# Patient Record
Sex: Male | Born: 1955
Health system: Southern US, Community
[De-identification: ages and names within clinical notes are randomized; demographics above are authoritative.]

## PROBLEM LIST (undated history)

## (undated) DIAGNOSIS — Z973 Presence of spectacles and contact lenses: Secondary | ICD-10-CM

## (undated) DIAGNOSIS — K219 Gastro-esophageal reflux disease without esophagitis: Secondary | ICD-10-CM

## (undated) DIAGNOSIS — M25812 Other specified joint disorders, left shoulder: Secondary | ICD-10-CM

## (undated) DIAGNOSIS — Z860101 Personal history of adenomatous and serrated colon polyps: Secondary | ICD-10-CM

## (undated) DIAGNOSIS — J302 Other seasonal allergic rhinitis: Secondary | ICD-10-CM

## (undated) DIAGNOSIS — Z86010 Personal history of colonic polyps: Secondary | ICD-10-CM

## (undated) DIAGNOSIS — Z8709 Personal history of other diseases of the respiratory system: Secondary | ICD-10-CM

## (undated) DIAGNOSIS — M7542 Impingement syndrome of left shoulder: Secondary | ICD-10-CM

## (undated) DIAGNOSIS — E781 Pure hyperglyceridemia: Secondary | ICD-10-CM

## (undated) DIAGNOSIS — I10 Essential (primary) hypertension: Secondary | ICD-10-CM

## (undated) DIAGNOSIS — E039 Hypothyroidism, unspecified: Secondary | ICD-10-CM

## (undated) DIAGNOSIS — E785 Hyperlipidemia, unspecified: Secondary | ICD-10-CM

## (undated) HISTORY — DX: Personal history of colonic polyps: Z86.010

## (undated) HISTORY — DX: Pure hyperglyceridemia: E78.1

## (undated) HISTORY — PX: TONSILLECTOMY: SUR1361

## (undated) HISTORY — PX: ROTATOR CUFF REPAIR: SHX139

## (undated) HISTORY — DX: Hypothyroidism, unspecified: E03.9

## (undated) HISTORY — DX: Hyperlipidemia, unspecified: E78.5

## (undated) HISTORY — DX: Other seasonal allergic rhinitis: J30.2

## (undated) HISTORY — DX: Gastro-esophageal reflux disease without esophagitis: K21.9

## (undated) HISTORY — DX: Essential (primary) hypertension: I10

## (undated) HISTORY — DX: Personal history of adenomatous and serrated colon polyps: Z86.0101

## (undated) HISTORY — PX: CHOLECYSTECTOMY: SHX55

---

## 1998-06-13 ENCOUNTER — Ambulatory Visit (HOSPITAL_COMMUNITY): Admission: RE | Admit: 1998-06-13 | Discharge: 1998-06-13 | Payer: Self-pay | Admitting: Neurosurgery

## 1998-06-13 ENCOUNTER — Encounter: Payer: Self-pay | Admitting: Neurosurgery

## 1998-06-21 ENCOUNTER — Encounter: Payer: Self-pay | Admitting: Neurosurgery

## 1998-06-23 ENCOUNTER — Encounter: Payer: Self-pay | Admitting: Neurosurgery

## 1998-06-23 ENCOUNTER — Inpatient Hospital Stay (HOSPITAL_COMMUNITY): Admission: RE | Admit: 1998-06-23 | Discharge: 1998-06-24 | Payer: Self-pay | Admitting: Neurosurgery

## 1999-03-13 HISTORY — PX: NECK SURGERY: SHX720

## 2000-03-16 ENCOUNTER — Encounter: Payer: Self-pay | Admitting: Neurosurgery

## 2000-03-16 ENCOUNTER — Ambulatory Visit (HOSPITAL_COMMUNITY): Admission: RE | Admit: 2000-03-16 | Discharge: 2000-03-16 | Payer: Self-pay | Admitting: Neurosurgery

## 2000-03-29 ENCOUNTER — Ambulatory Visit (HOSPITAL_COMMUNITY): Admission: RE | Admit: 2000-03-29 | Discharge: 2000-03-29 | Payer: Self-pay | Admitting: Neurosurgery

## 2000-03-29 ENCOUNTER — Encounter: Payer: Self-pay | Admitting: Neurosurgery

## 2000-04-12 ENCOUNTER — Ambulatory Visit (HOSPITAL_COMMUNITY): Admission: RE | Admit: 2000-04-12 | Discharge: 2000-04-12 | Payer: Self-pay | Admitting: Neurosurgery

## 2000-04-12 ENCOUNTER — Encounter: Payer: Self-pay | Admitting: Neurosurgery

## 2000-04-26 ENCOUNTER — Encounter: Payer: Self-pay | Admitting: Neurosurgery

## 2000-04-26 ENCOUNTER — Ambulatory Visit (HOSPITAL_COMMUNITY): Admission: RE | Admit: 2000-04-26 | Discharge: 2000-04-26 | Payer: Self-pay | Admitting: Neurosurgery

## 2001-05-30 ENCOUNTER — Ambulatory Visit (HOSPITAL_COMMUNITY): Admission: RE | Admit: 2001-05-30 | Discharge: 2001-05-30 | Payer: Self-pay | Admitting: Internal Medicine

## 2001-05-30 ENCOUNTER — Encounter: Payer: Self-pay | Admitting: Internal Medicine

## 2001-06-26 ENCOUNTER — Ambulatory Visit (HOSPITAL_COMMUNITY): Admission: RE | Admit: 2001-06-26 | Discharge: 2001-06-26 | Payer: Self-pay | Admitting: Otolaryngology

## 2001-06-26 ENCOUNTER — Encounter: Payer: Self-pay | Admitting: Otolaryngology

## 2001-07-24 ENCOUNTER — Ambulatory Visit (HOSPITAL_COMMUNITY): Admission: RE | Admit: 2001-07-24 | Discharge: 2001-07-24 | Payer: Self-pay | Admitting: Internal Medicine

## 2001-07-25 ENCOUNTER — Encounter: Payer: Self-pay | Admitting: Internal Medicine

## 2003-09-07 ENCOUNTER — Ambulatory Visit: Admission: RE | Admit: 2003-09-07 | Discharge: 2003-09-07 | Payer: Self-pay | Admitting: Neurosurgery

## 2004-02-01 ENCOUNTER — Ambulatory Visit: Payer: Self-pay | Admitting: Pulmonary Disease

## 2004-03-12 HISTORY — PX: COLONOSCOPY: SHX174

## 2004-03-12 HISTORY — PX: ROTATOR CUFF REPAIR: SHX139

## 2004-06-02 ENCOUNTER — Ambulatory Visit: Payer: Self-pay | Admitting: *Deleted

## 2004-06-12 ENCOUNTER — Ambulatory Visit: Payer: Self-pay | Admitting: *Deleted

## 2004-06-12 ENCOUNTER — Ambulatory Visit (HOSPITAL_COMMUNITY): Admission: RE | Admit: 2004-06-12 | Discharge: 2004-06-12 | Payer: Self-pay | Admitting: *Deleted

## 2004-08-08 ENCOUNTER — Emergency Department (HOSPITAL_COMMUNITY): Admission: EM | Admit: 2004-08-08 | Discharge: 2004-08-08 | Payer: Self-pay | Admitting: Emergency Medicine

## 2004-08-21 ENCOUNTER — Ambulatory Visit (HOSPITAL_COMMUNITY): Admission: RE | Admit: 2004-08-21 | Discharge: 2004-08-21 | Payer: Self-pay | Admitting: Neurosurgery

## 2004-08-28 ENCOUNTER — Encounter: Admission: RE | Admit: 2004-08-28 | Discharge: 2004-08-28 | Payer: Self-pay | Admitting: Neurosurgery

## 2004-09-22 ENCOUNTER — Encounter: Admission: RE | Admit: 2004-09-22 | Discharge: 2004-09-22 | Payer: Self-pay | Admitting: Neurosurgery

## 2004-11-27 ENCOUNTER — Encounter (HOSPITAL_COMMUNITY): Admission: RE | Admit: 2004-11-27 | Discharge: 2004-12-09 | Payer: Self-pay | Admitting: Neurosurgery

## 2004-12-11 ENCOUNTER — Encounter (HOSPITAL_COMMUNITY): Admission: RE | Admit: 2004-12-11 | Discharge: 2005-01-10 | Payer: Self-pay | Admitting: Neurosurgery

## 2005-02-16 ENCOUNTER — Ambulatory Visit (HOSPITAL_COMMUNITY): Admission: RE | Admit: 2005-02-16 | Discharge: 2005-02-16 | Payer: Self-pay | Admitting: Internal Medicine

## 2005-02-16 ENCOUNTER — Encounter: Payer: Self-pay | Admitting: Obstetrics & Gynecology

## 2005-02-16 ENCOUNTER — Ambulatory Visit: Payer: Self-pay | Admitting: Internal Medicine

## 2008-01-22 ENCOUNTER — Ambulatory Visit (HOSPITAL_COMMUNITY): Admission: RE | Admit: 2008-01-22 | Discharge: 2008-01-22 | Payer: Self-pay | Admitting: Internal Medicine

## 2010-03-07 ENCOUNTER — Encounter (INDEPENDENT_AMBULATORY_CARE_PROVIDER_SITE_OTHER): Payer: Self-pay

## 2010-04-13 NOTE — Letter (Signed)
Summary: Recall, Screening Colonoscopy Only  [facility]  [address]   [place], [place] [address]   Phone: [phone]  Fax: [phone]    March 07, 2010  Louis Mccullough [address] [place], [place]  [address] 10-14-1955   Dear Louis Mccullough,   Our records indicate it is time to schedule your colonoscopy.    Please call our office at [phone] and ask for the nurse.   Thank you,  Hendricks Limes, LPN Cloria Spring, LPN  [facility] Ph: [phone]   Fax: [phone]

## 2010-07-28 NOTE — Procedures (Signed)
[facility]  Patient:    Louis Mccullough, SOKOLOWSKI Visit Number: [id number] MRN: [id number]          Service Type: OUT Location: RAD Attending Physician:  Carylon Perches Dictated by:   Carylon Perches, M.D. Proc. Date: 07/24/01 Admit Date:  07/24/2001                                Stress Test  Mr. Propes exercised 10 minutes 3 seconds (1 minute, 3 seconds into stage IV of the Bruce protocol) attaining a maximal heart rate of 160 (92% of the age predicted maximal heart rate) at a work load of 12.9 METS and discontinued exercise due to his blood pressure elevation.  There was a peak blood pressure response at 246/98 during stage III.  There were no symptoms of chest pain. There were no arrhythmias.  There were no ST segment changes diagnostic of ischemia, although he had approximately 0.5 mm ST segment depression laterally.  Cardiolite images are pending.  IMPRESSION:  No definite evidence of exercise induced ischemia, hypertensive blood pressure response to exercise.  Cardiolite images pending. Dictated by:   Carylon Perches, M.D. Attending Physician:  Carylon Perches DD:  07/24/01 TD:  07/27/01 Job: [id number]

## 2010-07-28 NOTE — Op Note (Signed)
NAME:  Louis Mccullough, Louis Mccullough                  ACCOUNT NO.:  [id number]   MEDICAL RECORD NO.:  [id number]          PATIENT TYPE:  AMB   LOCATION:  DAY                           FACILITY:  [facility]   PHYSICIAN:  R. Roetta Sessions, M.D. DATE OF BIRTH:  December 06, 1955   DATE OF PROCEDURE:  02/16/2005  DATE OF DISCHARGE:                                 OPERATIVE REPORT   PROCEDURE:  Colonoscopy with biopsy.   INDICATIONS FOR PROCEDURE:  The patient is a 55 year old gentleman devoid of  any lower GI tract symptoms with no family history of colorectal cancer in  first degree relatives. He has never had his lower GI tract imaged. He is  now referred by Dr. Carylon Perches for colorectal cancer screening. Colonoscopy  is now being done as a screening maneuver. This approach has been discussed  with the patient at length. Potential risks, benefits, and alternatives have  been reviewed and questions answered. He is agreeable. Please see  documentation in the medical record.   PROCEDURE NOTE:  O2 saturation, blood pressure, pulse, and respirations were  monitored throughout the entire procedure. Conscious sedation with Versed 6  mg IV and Demerol 125 mg IV in divided doses.   INSTRUMENT:  Olympus video chip system.   FINDINGS:  Digital rectal exam revealed no abnormalities.   ENDOSCOPIC FINDINGS:  Prep was adequate.   Rectum:  Examination of the rectal mucosa including retroflexed view of the  anal verge revealed no abnormalities.   Colon:  Colonic mucosa was surveyed from the rectosigmoid junction through  the left, transverse, and right colon to the area of the appendiceal  orifice, ileocecal valve, and cecum. These structures were well seen and  photographed for the record. From this level, the scope was slowly  withdrawn, and all previously mentioned mucosal surfaces were again seen.  The patient had scattered left sided diverticula and a single diminutive 2-  mm polyp at the splenic flexure. The remainder of  the colonic mucosa  appeared normal. The polyp was cold biopsied/removed. The patient tolerated  the procedure well and was reactive to endoscopy.   IMPRESSION:  1.  Normal rectum.  2.  Left sided diverticula. Diminutive polyp at the splenic flexure, cold      biopsied/removed. The remainder of the colonic mucosa appeared normal.   RECOMMENDATIONS:  1.  Diverticulosis literature provided to [name].  2.  Follow up on pathology.  3.  Further recommendations to follow.      Jonathon Bellows, M.D.  Electronically Signed     RMR/MEDQ  D:  02/16/2005  T:  02/16/2005  Job:  [id number]   cc:   Kingsley Callander. Ouida Sills, MD  Fax: [phone]

## 2010-07-28 NOTE — Procedures (Signed)
NAME:  Louis Mccullough, FRERICKS NO.:  [id number]   MEDICAL RECORD NO.:  [id number]         PATIENT TYPE:  REC   LOCATION:  RAD                           FACILITY:  [facility]   PHYSICIAN:  Vida Roller, M.D.   DATE OF BIRTH:  Mar 30, 1955   DATE OF PROCEDURE:  DATE OF DISCHARGE:                                    STRESS TEST   HISTORY:  A 55 year old gentleman with no known coronary disease with  atypical chest discomfort, cardiac risk factors including dyslipidemia,  hypertension and family history.   BASELINE DATA:  EKG reveals a sinus rhythm at 74 beats per minute with  nonspecific ST abnormalities.  Blood pressure is 140/88.   The patient exercised for a total of nine minutes to 10.1 METS.  Maximum  heart rate achieved was 153 beats per minute which is 90% of predicted  maximum.  Maximum blood pressure is 160/88 and resolved down to 132/70 in  recovery.  The patient was noted to have some inferolateral ST depression  and T wave inversion which resolved in recovery.  A few PVCs were noted.  The patient denied any chest discomfort.  He did have some shortness of  breath at the end of exercise.   Final images and results are pending in the review.      AB/MEDQ  D:  06/12/2004  T:  06/12/2004  Job:  [id number]

## 2010-07-28 NOTE — Procedures (Signed)
NAME:  Louis Mccullough, Louis Mccullough NO.:  [id number]   MEDICAL RECORD NO.:  [id number]          PATIENT TYPE:  OUT   LOCATION:  SLEEP LAB                     FACILITY:  [facility]   PHYSICIAN:  Marcelyn Bruins, M.D. [facility] DATE OF BIRTH:  Nov 08, 1955   DATE OF ADMISSION:  09/07/2003  DATE OF DISCHARGE:  09/07/2003                              NOCTURNAL POLYSOMNOGRAM   REFERRING PHYSICIAN:  Dr. Trey Sailors   INDICATION FOR THE STUDY:  Hypersomnia with sleep apnea.   SLEEP ARCHITECTURE:  Total sleep time was 326 minutes with decreased REM  being noted.  There was normal sleep onset latency but a mildly prolonged  REM onset latency.   IMPRESSIONS/RECOMMENDATIONS:  1. Mild obstructive sleep apnea-hypopnea syndrome with a Respiratory     Disturbance Index of 14 events per hour and mild oxygen desaturation.     Events were not positional however, they were clearly rapid eye movement     related.  The patient did not meet split-night criteria due to the small     numbers of events.  2. Loud snoring noted.  3. No cardiac arrhythmias.  4. Large numbers of leg movements with significant sleep disruption.     Clinical correlation is suggested.                                   ______________________________                                Marcelyn Bruins, M.D. [facility]     KC/MEDQ  D:  09/24/2003 16:48:45  T:  09/25/2003 22:37:59  Job:  [id number]

## 2010-08-29 ENCOUNTER — Encounter: Payer: Self-pay | Admitting: Gastroenterology

## 2010-08-29 ENCOUNTER — Ambulatory Visit (INDEPENDENT_AMBULATORY_CARE_PROVIDER_SITE_OTHER): Payer: 59 | Admitting: Gastroenterology

## 2010-08-29 VITALS — BP 131/79 | HR 68 | Temp 97.3°F | Ht 69.0 in | Wt 214.3 lb

## 2010-08-29 DIAGNOSIS — Z860101 Personal history of adenomatous and serrated colon polyps: Secondary | ICD-10-CM | POA: Insufficient documentation

## 2010-08-29 DIAGNOSIS — K219 Gastro-esophageal reflux disease without esophagitis: Secondary | ICD-10-CM

## 2010-08-29 DIAGNOSIS — Z800 Family history of malignant neoplasm of digestive organs: Secondary | ICD-10-CM

## 2010-08-29 DIAGNOSIS — Z86010 Personal history of colonic polyps: Secondary | ICD-10-CM

## 2010-08-29 NOTE — Assessment & Plan Note (Signed)
History of adenomatous colonic polyp back in 2006. Due for surveillance colonoscopy. Family history of colon cancer in a second degree relative at an advanced age but no first-degree relatives with history of colon cancer. I have discussed the risks, alternatives, benefits with regards to but not limited to the risk of reaction to medication, bleeding, infection, perforation and the patient is agreeable to proceed. Written consent to be obtained.

## 2010-08-29 NOTE — Assessment & Plan Note (Signed)
Well-controlled on omeprazole. He reports h/o remote EGD which was unremarkable. Records currently in storage. Will try to obtain.

## 2010-08-29 NOTE — Progress Notes (Signed)
Cc to PCP 

## 2010-08-29 NOTE — Progress Notes (Signed)
Primary Care Physician:  Carylon Perches, MD  Primary Gastroenterologist:  Roetta Sessions, MD  Chief Complaint  Patient presents with  . Colonoscopy    HPI:  Louis Mccullough is a 55 y.o. male here to schedule surveillance colonoscopy given history of adenomatous colonic polyp. His last colonoscopy was in 2006. His maternal grandfather had colon cancer in his 14s. Patient denies constipation, diarrhea, melena, rectal bleeding, abdominal pain, nausea, vomiting, weight loss. Heartburn well controlled on omeprazole. He states he had a remote EGD by Dr. [name]. His file is currently in storage. We'll try to retrieve records. No EGD report in [place].  Current Outpatient Prescriptions  Medication Sig Dispense Refill  . allopurinol (ZYLOPRIM) 300 MG tablet Take 300 mg by mouth daily.        Marland Kitchen amLODipine (NORVASC) 10 MG tablet Take 10 mg by mouth daily.        Marland Kitchen aspirin 81 MG tablet Take 81 mg by mouth daily.        . fish oil-omega-3 fatty acids 1000 MG capsule Take 2 g by mouth daily.        . fluticasone (FLONASE) 50 MCG/ACT nasal spray       . MICARDIS 80 MG tablet       . omeprazole (PRILOSEC) 20 MG capsule Take 20 mg by mouth daily.        . simvastatin (ZOCOR) 20 MG tablet Take 20 mg by mouth at bedtime.        Marland Kitchen SYNTHROID 100 MCG tablet       . TRICOR 145 MG tablet         Allergies as of 08/29/2010  . (No Known Allergies)    Past Medical History  Diagnosis Date  . HTN (hypertension)   . Seasonal allergies   . Hypothyroidism   . Gout   . Hypertriglyceridemia   . Hyperlipidemia   . GERD (gastroesophageal reflux disease)     states he had previous EGD  . Hx of adenomatous colonic polyps     Past Surgical History  Procedure Date  . Rotator cuff repair   . Neck surgery   . Cholecystectomy   . Tonsillectomy   . Colonoscopy 2006    adenomatous polyp, diverticulosis    Family History  Problem Relation Age of Onset  . Colon cancer Maternal Grandfather 46    died age 17  . Prostate  cancer Father 19    died age 6    History   Social History  . Marital Status: Married    Spouse Name: N/A    Number of Children: 2  . Years of Education: N/A   Occupational History  .  [employer]   Social History Main Topics  . Smoking status: Current Some Day Smoker -- 0.3 packs/day    Types: Cigarettes  . Smokeless tobacco: Not on file  . Alcohol Use: Yes     2-3 drinks a day  . Drug Use: No  . Sexually Active: Not on file   Other Topics Concern  . Not on file   Social History Narrative  . No narrative on file      ROS:  General: Negative for anorexia, weight loss, fever, chills, fatigue, weakness. Eyes: Negative for vision changes.  ENT: Negative for hoarseness, difficulty swallowing , nasal congestion. CV: Negative for chest pain, angina, palpitations, dyspnea on exertion, peripheral edema.  Respiratory: Negative for dyspnea at rest, dyspnea on exertion, cough, sputum, wheezing.  GI: See history of  present illness. GU:  Negative for dysuria, hematuria, urinary incontinence, urinary frequency, nocturnal urination.  MS: Negative for joint pain, low back pain.  Derm: Negative for rash or itching.  Neuro: Negative for weakness, abnormal sensation, seizure, frequent headaches, memory loss, confusion.  Psych: Negative for anxiety, depression, suicidal ideation, hallucinations.  Endo: Negative for unusual weight change.  Heme: Negative for bruising or bleeding. Allergy: Negative for rash or hives.    Physical Examination:  BP 131/79  Pulse 68  Temp(Src) 97.3 F (36.3 C) (Temporal)  Ht 5\' 9"  (1.753 m)  Wt 214 lb 4.8 oz (97.206 kg)  BMI 31.65 kg/m2   General: Well-nourished, well-developed in no acute distress.  Head: Normocephalic, atraumatic.   Eyes: Conjunctiva pink, no icterus. Mouth: Oropharyngeal mucosa moist and pink , no lesions erythema or exudate. Neck: Supple without thyromegaly, masses, or lymphadenopathy.  Lungs: Clear to auscultation  bilaterally.  Heart: Regular rate and rhythm, no murmurs rubs or gallops.  Abdomen: Bowel sounds are normal, nontender, nondistended, no hepatosplenomegaly or masses, no abdominal bruits or    hernia , no rebound or guarding.   Extremities: No lower extremity edema.  Neuro: Alert and oriented x 4 , grossly normal neurologically.  Skin: Warm and dry, no rash or jaundice.   Psych: Alert and cooperative, normal mood and affect.

## 2010-09-05 ENCOUNTER — Encounter: Payer: Self-pay | Admitting: General Practice

## 2010-09-14 NOTE — Progress Notes (Signed)
Please see if [facility] has copy of his old EGD report.

## 2010-09-15 NOTE — Progress Notes (Signed)
Patient has an old TCS report and its on your desk but no EGD report

## 2010-09-18 NOTE — Progress Notes (Signed)
Could not find old EGD reports. Have last TCS report from 2006, "left sided divertcula. Diminutive polyp at the splenic flexure".  Would consider EGD for chronic GERD at later date if confirmed no prior EGD.  Would recommend Dr. [name] to review at time of TCS.

## 2010-09-28 ENCOUNTER — Telehealth: Payer: Self-pay

## 2010-09-28 NOTE — Telephone Encounter (Signed)
OK for colonoscopy w/ Dr [name]

## 2010-09-28 NOTE — Telephone Encounter (Signed)
Gastroenterology Pre-Procedure Form      PATIENT INFORMATION:  Louis Mccullough is a 55 y.o., male (DOB=1955-03-22).  PROCEDURE: Procedure(s) requested: colonoscopy Procedure Reason: previous adenomatous polyp  PATIENT REVIEW QUESTIONS: The patient reports the following:   1. Diabetes Melitis: no 2. Joint replacements in the past 12 months: no 3. Major health problems in the past 3 months: no 4. Has an artificial valve or MVP:no 5. Has been advised in past to take antibiotics in advance of a procedure like teeth cleaning: no}    MEDICATIONS & ALLERGIES:    Patient reports the following regarding taking any blood thinners:   Plavix? no Aspirin?Yes Coumadin?  no  Patient confirms/reports the following medications:    Patient confirms/reports the following allergies:  No Known Allergies  Patient is appropriate to schedule for requested procedure(s): yes  AUTHORIZATION INFORMATION Primary Insurance Pre-Cert / Auth required: Pre-Cert / Auth #:   Secondary Insurance:  Pre-Cert / Auth required: Pre-Cert / Auth #  No orders of the defined types were placed in this encounter.    SCHEDULE INFORMATION: Procedure has been scheduled as follows:  Date: 09/29/2010    Time: 8:15 AM  Location: [facility] Short Stay  This Gastroenterology Pre-Precedure Form is being routed to the following provider(s) for review: R. Roetta Sessions, MD          :         Patient confirms/reports the following medications:  Current Outpatient Prescriptions  Medication Sig Dispense Refill  . allopurinol (ZYLOPRIM) 300 MG tablet Take 300 mg by mouth daily.        Marland Kitchen amLODipine (NORVASC) 10 MG tablet Take 10 mg by mouth daily.        Marland Kitchen aspirin 81 MG tablet Take 81 mg by mouth daily.        . fish oil-omega-3 fatty acids 1000 MG capsule Take 2 g by mouth daily.        . fluticasone (FLONASE) 50 MCG/ACT nasal spray       . MICARDIS 80 MG tablet       . omeprazole (PRILOSEC) 20 MG capsule  Take 20 mg by mouth daily.        . simvastatin (ZOCOR) 20 MG tablet Take 20 mg by mouth at bedtime.        Marland Kitchen SYNTHROID 100 MCG tablet       . TRICOR 145 MG tablet         Patient confirms/reports the following allergies:  No Known Allergies  Patient is appropriate to schedule for requested procedure(s): yes      No orders of the defined types were placed in this encounter.     Procedure has been scheduled as follows:  Date: 09/29/2010   Time: 8:15 AM  Location: [facility]   This Gastroenterology Pre-Precedure Form is being routed to the following provider(s) for review: Lorenza Burton, NP

## 2010-09-29 ENCOUNTER — Encounter: Payer: 59 | Admitting: Internal Medicine

## 2010-09-29 ENCOUNTER — Ambulatory Visit (HOSPITAL_COMMUNITY)
Admission: RE | Admit: 2010-09-29 | Discharge: 2010-09-29 | Disposition: A | Discharge status: Home / Self Care | Payer: 59 | Source: Ambulatory Visit | Attending: Internal Medicine | Admitting: Internal Medicine

## 2010-09-29 ENCOUNTER — Other Ambulatory Visit: Payer: Self-pay | Admitting: Internal Medicine

## 2010-09-29 ENCOUNTER — Encounter (HOSPITAL_COMMUNITY): Payer: Self-pay | Admitting: *Deleted

## 2010-09-29 ENCOUNTER — Encounter (HOSPITAL_COMMUNITY)
Admission: RE | Disposition: A | Discharge status: Home / Self Care | Payer: Self-pay | Source: Ambulatory Visit | Attending: Internal Medicine

## 2010-09-29 DIAGNOSIS — Z7982 Long term (current) use of aspirin: Secondary | ICD-10-CM | POA: Insufficient documentation

## 2010-09-29 DIAGNOSIS — Z860100 Personal history of colon polyps, unspecified: Secondary | ICD-10-CM | POA: Insufficient documentation

## 2010-09-29 DIAGNOSIS — Z1211 Encounter for screening for malignant neoplasm of colon: Secondary | ICD-10-CM

## 2010-09-29 DIAGNOSIS — D126 Benign neoplasm of colon, unspecified: Secondary | ICD-10-CM | POA: Insufficient documentation

## 2010-09-29 DIAGNOSIS — K5730 Diverticulosis of large intestine without perforation or abscess without bleeding: Secondary | ICD-10-CM

## 2010-09-29 DIAGNOSIS — E785 Hyperlipidemia, unspecified: Secondary | ICD-10-CM | POA: Insufficient documentation

## 2010-09-29 DIAGNOSIS — Z09 Encounter for follow-up examination after completed treatment for conditions other than malignant neoplasm: Secondary | ICD-10-CM | POA: Insufficient documentation

## 2010-09-29 DIAGNOSIS — I10 Essential (primary) hypertension: Secondary | ICD-10-CM | POA: Insufficient documentation

## 2010-09-29 HISTORY — PX: COLONOSCOPY: SHX5424

## 2010-09-29 SURGERY — COLONOSCOPY
Anesthesia: Moderate Sedation

## 2010-09-29 MED ORDER — MIDAZOLAM HCL 5 MG/5ML IJ SOLN
INTRAMUSCULAR | Status: DC | PRN
  Administered 2010-09-29 (×2): 1 mg via INTRAVENOUS
  Administered 2010-09-29: 2 mg via INTRAVENOUS
  Administered 2010-09-29 (×2): 1 mg via INTRAVENOUS

## 2010-09-29 MED ORDER — MEPERIDINE HCL 100 MG/ML IJ SOLN
INTRAMUSCULAR | Status: DC | PRN
  Administered 2010-09-29: 25 mg via INTRAVENOUS
  Administered 2010-09-29: 50 mg via INTRAVENOUS
  Administered 2010-09-29 (×2): 25 mg via INTRAVENOUS

## 2010-09-29 MED ORDER — MEPERIDINE HCL 100 MG/ML IJ SOLN
INTRAMUSCULAR | Status: AC
  Filled 2010-09-29: qty 2

## 2010-09-29 MED ORDER — MIDAZOLAM HCL 5 MG/5ML IJ SOLN
INTRAMUSCULAR | Status: AC
  Filled 2010-09-29: qty 10

## 2010-09-29 MED ORDER — SODIUM CHLORIDE 0.45 % IV SOLN
Freq: Once | INTRAVENOUS | Status: AC
  Administered 2010-09-29: 09:00:00 via INTRAVENOUS

## 2010-09-29 NOTE — H&P (Signed)
[name], PA  08/29/2010  2:36 PM  Signed Primary Care Physician:  Carylon Perches, MD   Primary Gastroenterologist:  Roetta Sessions, MD    Chief Complaint   Patient presents with   .  Colonoscopy      HPI:  Louis Mccullough is a 55 y.o. male here to schedule surveillance colonoscopy given history of adenomatous colonic polyp. His last colonoscopy was in 2006. His maternal grandfather had colon cancer in his 16s. Patient denies constipation, diarrhea, melena, rectal bleeding, abdominal pain, nausea, vomiting, weight loss. Heartburn well controlled on omeprazole. He states he had a remote EGD by Dr. [name]. His file is currently in storage. We'll try to retrieve records. No EGD report in [place].    Current Outpatient Prescriptions   Medication  Sig  Dispense  Refill   .  allopurinol (ZYLOPRIM) 300 MG tablet  Take 300 mg by mouth daily.           Marland Kitchen  amLODipine (NORVASC) 10 MG tablet  Take 10 mg by mouth daily.           Marland Kitchen  aspirin 81 MG tablet  Take 81 mg by mouth daily.           .  fish oil-omega-3 fatty acids 1000 MG capsule  Take 2 g by mouth daily.           .  fluticasone (FLONASE) 50 MCG/ACT nasal spray           .  MICARDIS 80 MG tablet           .  omeprazole (PRILOSEC) 20 MG capsule  Take 20 mg by mouth daily.           .  simvastatin (ZOCOR) 20 MG tablet  Take 20 mg by mouth at bedtime.           Marland Kitchen  SYNTHROID 100 MCG tablet           .  TRICOR 145 MG tablet               Allergies as of 08/29/2010   .  (No Known Allergies)       Past Medical History   Diagnosis  Date   .  HTN (hypertension)     .  Seasonal allergies     .  Hypothyroidism     .  Gout     .  Hypertriglyceridemia     .  Hyperlipidemia     .  GERD (gastroesophageal reflux disease)         states he had previous EGD   .  Hx of adenomatous colonic polyps         Past Surgical History   Procedure  Date   .  Rotator cuff repair     .  Neck surgery     .  Cholecystectomy     .  Tonsillectomy     .   Colonoscopy  2006       adenomatous polyp, diverticulosis       Family History   Problem  Relation  Age of Onset   .  Colon cancer  Maternal Grandfather  75       died age 3   .  Prostate cancer  Father  47       died age 90       History       Social History   .  Marital Status:  Married  Spouse Name:  N/A       Number of Children:  2   .  Years of Education:  N/A       Occupational History   .    [employer]       Social History Main Topics   .  Smoking status:  Current Some Day Smoker -- 0.3 packs/day       Types:  Cigarettes   .  Smokeless tobacco:  Not on file   .  Alcohol Use:  Yes         2-3 drinks a day   .  Drug Use:  No   .  Sexually Active:  Not on file       Other Topics  Concern   .  Not on file       Social History Narrative   .  No narrative on file        ROS:   General: Negative for anorexia, weight loss, fever, chills, fatigue, weakness. Eyes: Negative for vision changes.   ENT: Negative for hoarseness, difficulty swallowing , nasal congestion. CV: Negative for chest pain, angina, palpitations, dyspnea on exertion, peripheral edema.   Respiratory: Negative for dyspnea at rest, dyspnea on exertion, cough, sputum, wheezing.   GI: See history of present illness. GU:  Negative for dysuria, hematuria, urinary incontinence, urinary frequency, nocturnal urination.   MS: Negative for joint pain, low back pain.   Derm: Negative for rash or itching.   Neuro: Negative for weakness, abnormal sensation, seizure, frequent headaches, memory loss, confusion.   Psych: Negative for anxiety, depression, suicidal ideation, hallucinations.   Endo: Negative for unusual weight change.   Heme: Negative for bruising or bleeding. Allergy: Negative for rash or hives.     Physical Examination:   BP 131/79  Pulse 68  Temp(Src) 97.3 F (36.3 C) (Temporal)  Ht 5\' 9"  (1.753 m)  Wt 214 lb 4.8 oz (97.206 kg)  BMI 31.65 kg/m2    General:  Well-nourished, well-developed in no acute distress.   Head: Normocephalic, atraumatic.    Eyes: Conjunctiva pink, no icterus. Mouth: Oropharyngeal mucosa moist and pink , no lesions erythema or exudate. Neck: Supple without thyromegaly, masses, or lymphadenopathy.   Lungs: Clear to auscultation bilaterally.   Heart: Regular rate and rhythm, no murmurs rubs or gallops.   Abdomen: Bowel sounds are normal, nontender, nondistended, no hepatosplenomegaly or masses, no abdominal bruits or    hernia , no rebound or guarding.    Extremities: No lower extremity edema.   Neuro: Alert and oriented x 4 , grossly normal neurologically.   Skin: Warm and dry, no rash or jaundice.    Psych: Alert and cooperative, normal mood and affect.          [name]  08/29/2010  1:30 PM  Signed Cc to PCP  [name]  08/29/2010  2:41 PM  Signed Cc to PCP  [name], PA  09/14/2010  4:42 PM  Signed Please see if [facility] has copy of his old EGD report.  [name]  09/15/2010  8:35 AM  Signed Patient has an old TCS report and its on your desk but no EGD report  [name], [place]  09/18/2010  2:54 PM  Signed Could not find old EGD reports. Have last TCS report from 2006, "left sided divertcula. Diminutive polyp at the splenic flexure".   Would consider EGD for chronic GERD at later date if confirmed no prior EGD.  Would recommend Dr. [name] to review at time of TCS.        Hx of adenomatous colonic polyps - [name], PA  08/29/2010  9:27 AM  Signed History of adenomatous colonic polyp back in 2006. Due for surveillance colonoscopy. Family history of colon cancer in a second degree relative at an advanced age but no first-degree relatives with history of colon cancer. I have discussed the risks, alternatives, benefits with regards to but not limited to the risk of reaction to medication, bleeding, infection, perforation and the patient is agreeable to proceed. Written consent to be obtained.     GERD  (gastroesophageal reflux disease) - [name], PA  08/29/2010  2:33 PM  Signed Well-controlled on omeprazole. He reports h/o remote EGD which was unremarkable. Records currently in storage. Will try to obtain.        I have seen the patient prior to the procedure(s) today and reviewed the history and physical / consultation from 09/28/10.  There have been no changes. After consideration of the risks, benefits, alternatives and imponderables, the patient has consented to the procedure(s).

## 2010-10-09 ENCOUNTER — Encounter (HOSPITAL_COMMUNITY): Payer: Self-pay | Admitting: Internal Medicine

## 2010-11-09 ENCOUNTER — Telehealth: Payer: Self-pay | Admitting: Internal Medicine

## 2010-11-09 NOTE — Telephone Encounter (Signed)
Pt called regarding his bill, he was upset that we did not code the procedure as a screening. I tried to explain to him why it was coded the way it was but  he would like to talk to you and wants you to call him at [phone] (W) or [phone] (H)

## 2010-11-10 NOTE — Telephone Encounter (Signed)
I spoke with the pt and explained the reasons the procedure was coded the way it was. I also told him that the PB charges were already paid by his insurance company.  He voiced understanding.    I told him I would call him back next week and follow-up on his bill for the hospital charges

## 2010-11-14 NOTE — Telephone Encounter (Signed)
I spoke with the patient and explained the hospital charges and he voiced understanding.

## 2013-12-28 ENCOUNTER — Other Ambulatory Visit: Payer: Self-pay | Admitting: Orthopedic Surgery

## 2014-02-11 ENCOUNTER — Encounter (HOSPITAL_BASED_OUTPATIENT_CLINIC_OR_DEPARTMENT_OTHER): Payer: Self-pay | Admitting: *Deleted

## 2014-02-11 NOTE — Progress Notes (Signed)
Pt had recent labs and ekg-called Wife is cardiac nurse for [employer]- Mild sleep apnea, no cpap needed Will need istat

## 2014-02-12 ENCOUNTER — Encounter (HOSPITAL_BASED_OUTPATIENT_CLINIC_OR_DEPARTMENT_OTHER)
Admission: RE | Disposition: A | Discharge status: Home / Self Care | Payer: Self-pay | Source: Ambulatory Visit | Attending: Orthopedic Surgery

## 2014-02-12 ENCOUNTER — Ambulatory Visit (HOSPITAL_BASED_OUTPATIENT_CLINIC_OR_DEPARTMENT_OTHER): Payer: BC Managed Care – PPO | Admitting: Anesthesiology

## 2014-02-12 ENCOUNTER — Ambulatory Visit (HOSPITAL_BASED_OUTPATIENT_CLINIC_OR_DEPARTMENT_OTHER)
Admission: RE | Admit: 2014-02-12 | Discharge: 2014-02-12 | Disposition: A | Discharge status: Home / Self Care | Payer: BC Managed Care – PPO | Source: Ambulatory Visit | Attending: Orthopedic Surgery | Admitting: Orthopedic Surgery

## 2014-02-12 ENCOUNTER — Encounter (HOSPITAL_BASED_OUTPATIENT_CLINIC_OR_DEPARTMENT_OTHER): Payer: Self-pay

## 2014-02-12 DIAGNOSIS — Z981 Arthrodesis status: Secondary | ICD-10-CM | POA: Insufficient documentation

## 2014-02-12 DIAGNOSIS — Z7982 Long term (current) use of aspirin: Secondary | ICD-10-CM | POA: Diagnosis not present

## 2014-02-12 DIAGNOSIS — G5601 Carpal tunnel syndrome, right upper limb: Secondary | ICD-10-CM | POA: Diagnosis not present

## 2014-02-12 DIAGNOSIS — E039 Hypothyroidism, unspecified: Secondary | ICD-10-CM | POA: Diagnosis not present

## 2014-02-12 DIAGNOSIS — E785 Hyperlipidemia, unspecified: Secondary | ICD-10-CM | POA: Diagnosis not present

## 2014-02-12 DIAGNOSIS — K219 Gastro-esophageal reflux disease without esophagitis: Secondary | ICD-10-CM | POA: Diagnosis not present

## 2014-02-12 DIAGNOSIS — M109 Gout, unspecified: Secondary | ICD-10-CM | POA: Insufficient documentation

## 2014-02-12 DIAGNOSIS — I10 Essential (primary) hypertension: Secondary | ICD-10-CM | POA: Diagnosis not present

## 2014-02-12 DIAGNOSIS — G5602 Carpal tunnel syndrome, left upper limb: Secondary | ICD-10-CM | POA: Insufficient documentation

## 2014-02-12 DIAGNOSIS — F17210 Nicotine dependence, cigarettes, uncomplicated: Secondary | ICD-10-CM | POA: Insufficient documentation

## 2014-02-12 DIAGNOSIS — E781 Pure hyperglyceridemia: Secondary | ICD-10-CM | POA: Diagnosis not present

## 2014-02-12 DIAGNOSIS — M47892 Other spondylosis, cervical region: Secondary | ICD-10-CM | POA: Insufficient documentation

## 2014-02-12 HISTORY — DX: Presence of spectacles and contact lenses: Z97.3

## 2014-02-12 HISTORY — PX: BILATERAL CARPAL TUNNEL RELEASE: SHX6508

## 2014-02-12 HISTORY — DX: Personal history of other diseases of the respiratory system: Z87.09

## 2014-02-12 SURGERY — BILATERAL CARPAL TUNNEL RELEASE
Anesthesia: General | Site: Hand | Laterality: Bilateral

## 2014-02-12 MED ORDER — MIDAZOLAM HCL 2 MG/2ML IJ SOLN: 1.0000 mg | INTRAMUSCULAR | Status: DC | PRN

## 2014-02-12 MED ORDER — BUPIVACAINE HCL (PF) 0.25 % IJ SOLN
INTRAMUSCULAR | Status: DC | PRN
  Administered 2014-02-12: 8 mL

## 2014-02-12 MED ORDER — HYDROCODONE-ACETAMINOPHEN 10-325 MG PO TABS: 1.0000 | ORAL_TABLET | Freq: Four times a day (QID) | ORAL | Status: DC | PRN

## 2014-02-12 MED ORDER — MIDAZOLAM HCL 5 MG/5ML IJ SOLN
INTRAMUSCULAR | Status: DC | PRN
  Administered 2014-02-12: 2 mg via INTRAVENOUS

## 2014-02-12 MED ORDER — FENTANYL CITRATE 0.05 MG/ML IJ SOLN: 50.0000 ug | INTRAMUSCULAR | Status: DC | PRN

## 2014-02-12 MED ORDER — DEXAMETHASONE SODIUM PHOSPHATE 4 MG/ML IJ SOLN
INTRAMUSCULAR | Status: DC | PRN
  Administered 2014-02-12: 10 mg via INTRAVENOUS

## 2014-02-12 MED ORDER — CEFAZOLIN SODIUM-DEXTROSE 2-3 GM-% IV SOLR: 2.0000 g | INTRAVENOUS | Status: DC

## 2014-02-12 MED ORDER — KETOROLAC TROMETHAMINE 30 MG/ML IJ SOLN
INTRAMUSCULAR | Status: DC | PRN
  Administered 2014-02-12: 30 mg via INTRAVENOUS

## 2014-02-12 MED ORDER — PROPOFOL 10 MG/ML IV BOLUS
INTRAVENOUS | Status: DC | PRN
  Administered 2014-02-12: 200 mg via INTRAVENOUS

## 2014-02-12 MED ORDER — CEFAZOLIN SODIUM-DEXTROSE 2-3 GM-% IV SOLR
2.0000 g | INTRAVENOUS | Status: AC
  Administered 2014-02-12: 2 g via INTRAVENOUS

## 2014-02-12 MED ORDER — LACTATED RINGERS IV SOLN
INTRAVENOUS | Status: DC
  Administered 2014-02-12: 12:00:00 via INTRAVENOUS

## 2014-02-12 MED ORDER — MIDAZOLAM HCL 2 MG/2ML IJ SOLN
INTRAMUSCULAR | Status: AC
  Filled 2014-02-12: qty 2

## 2014-02-12 MED ORDER — CEFAZOLIN SODIUM-DEXTROSE 2-3 GM-% IV SOLR
INTRAVENOUS | Status: AC
  Filled 2014-02-12: qty 50

## 2014-02-12 MED ORDER — ONDANSETRON HCL 4 MG/2ML IJ SOLN
INTRAMUSCULAR | Status: DC | PRN
  Administered 2014-02-12: 4 mg via INTRAVENOUS

## 2014-02-12 MED ORDER — FENTANYL CITRATE 0.05 MG/ML IJ SOLN
INTRAMUSCULAR | Status: AC
  Filled 2014-02-12: qty 6

## 2014-02-12 MED ORDER — FENTANYL CITRATE 0.05 MG/ML IJ SOLN
INTRAMUSCULAR | Status: DC | PRN
  Administered 2014-02-12: 100 ug via INTRAVENOUS
  Administered 2014-02-12: 25 ug via INTRAVENOUS

## 2014-02-12 MED ORDER — CHLORHEXIDINE GLUCONATE 4 % EX LIQD: 60.0000 mL | Freq: Once | CUTANEOUS | Status: DC

## 2014-02-12 SURGICAL SUPPLY — 42 items
BENZOIN TINCTURE PRP APPL 2/3 (GAUZE/BANDAGES/DRESSINGS) ×6 IMPLANT
BLADE SURG 15 STRL LF DISP TIS (BLADE) ×1 IMPLANT
BLADE SURG 15 STRL SS (BLADE) ×2
BNDG COHESIVE 3X5 TAN STRL LF (GAUZE/BANDAGES/DRESSINGS) ×6 IMPLANT
BNDG ESMARK 4X9 LF (GAUZE/BANDAGES/DRESSINGS) ×6 IMPLANT
BNDG GAUZE ELAST 4 BULKY (GAUZE/BANDAGES/DRESSINGS) ×6 IMPLANT
CHLORAPREP W/TINT 26ML (MISCELLANEOUS) ×6 IMPLANT
CLOSURE WOUND 1/2 X4 (GAUZE/BANDAGES/DRESSINGS) ×1
CORDS BIPOLAR (ELECTRODE) ×6 IMPLANT
COVER BACK TABLE 60X90IN (DRAPES) ×3 IMPLANT
COVER MAYO STAND STRL (DRAPES) ×6 IMPLANT
CUFF TOURNIQUET SINGLE 18IN (TOURNIQUET CUFF) ×6 IMPLANT
DRAPE EXTREMITY T 121X128X90 (DRAPE) ×6 IMPLANT
DRAPE SURG 17X23 STRL (DRAPES) ×6 IMPLANT
DRSG PAD ABDOMINAL 8X10 ST (GAUZE/BANDAGES/DRESSINGS) ×3 IMPLANT
GAUZE SPONGE 4X4 12PLY STRL (GAUZE/BANDAGES/DRESSINGS) ×3 IMPLANT
GAUZE XEROFORM 1X8 LF (GAUZE/BANDAGES/DRESSINGS) ×3 IMPLANT
GLOVE BIO SURGEON STRL SZ 6.5 (GLOVE) ×2 IMPLANT
GLOVE BIO SURGEONS STRL SZ 6.5 (GLOVE) ×1
GLOVE BIOGEL M 7.0 STRL (GLOVE) ×3 IMPLANT
GLOVE BIOGEL PI IND STRL 8.5 (GLOVE) ×1 IMPLANT
GLOVE BIOGEL PI INDICATOR 8.5 (GLOVE) ×2
GLOVE SURG ORTHO 8.0 STRL STRW (GLOVE) ×3 IMPLANT
GOWN STRL REUS W/ TWL LRG LVL3 (GOWN DISPOSABLE) ×1 IMPLANT
GOWN STRL REUS W/TWL LRG LVL3 (GOWN DISPOSABLE) ×2
GOWN STRL REUS W/TWL XL LVL3 (GOWN DISPOSABLE) ×3 IMPLANT
LIQUID BAND (GAUZE/BANDAGES/DRESSINGS) ×6 IMPLANT
NEEDLE 27GAX1X1/2 (NEEDLE) IMPLANT
NS IRRIG 1000ML POUR BTL (IV SOLUTION) ×3 IMPLANT
PACK BASIN DAY SURGERY FS (CUSTOM PROCEDURE TRAY) ×3 IMPLANT
PAD ALCOHOL SWAB (MISCELLANEOUS) ×6 IMPLANT
PADDING CAST ABS 4INX4YD NS (CAST SUPPLIES) ×2
PADDING CAST ABS COTTON 4X4 ST (CAST SUPPLIES) ×1 IMPLANT
STOCKINETTE 4X48 STRL (DRAPES) ×6 IMPLANT
STRIP CLOSURE SKIN 1/2X4 (GAUZE/BANDAGES/DRESSINGS) ×2 IMPLANT
SUT CHROMIC 4 0 P 3 18 (SUTURE) ×3 IMPLANT
SUT VICRYL 4-0 PS2 18IN ABS (SUTURE) IMPLANT
SUT VICRYL RAPIDE 4/0 PS 2 (SUTURE) IMPLANT
SYR BULB 3OZ (MISCELLANEOUS) ×3 IMPLANT
SYR CONTROL 10ML LL (SYRINGE) ×3 IMPLANT
TOWEL OR 17X24 6PK STRL BLUE (TOWEL DISPOSABLE) ×3 IMPLANT
UNDERPAD 30X30 INCONTINENT (UNDERPADS AND DIAPERS) ×6 IMPLANT

## 2014-02-12 NOTE — Anesthesia Procedure Notes (Signed)
Procedure Name: LMA Insertion Performed by: Micai Apolinar W Pre-anesthesia Checklist: Patient identified, Timeout performed, Emergency Drugs available, Suction available and Patient being monitored Patient Re-evaluated:Patient Re-evaluated prior to inductionOxygen Delivery Method: Circle system utilized Preoxygenation: Pre-oxygenation with 100% oxygen Intubation Type: IV induction Ventilation: Mask ventilation without difficulty LMA: LMA inserted LMA Size: 5.0 Number of attempts: 1 Placement Confirmation: positive ETCO2 and breath sounds checked- equal and bilateral Tube secured with: Tape Dental Injury: Teeth and Oropharynx as per pre-operative assessment      

## 2014-02-12 NOTE — Anesthesia Preprocedure Evaluation (Signed)
Anesthesia Evaluation  Patient identified by MRN, date of birth, ID band Patient awake    Reviewed: Allergy & Precautions, H&P , NPO status , Patient's Chart, lab work & pertinent test results  Airway Mallampati: I TM Distance: >3 FB Neck ROM: Full    Dental   Pulmonary Current Smoker,          Cardiovascular hypertension, Pt. on medications     Neuro/Psych    GI/Hepatic GERD-  Medicated and Controlled,  Endo/Other    Renal/GU      Musculoskeletal   Abdominal   Peds  Hematology   Anesthesia Other Findings   Reproductive/Obstetrics                           Anesthesia Physical Anesthesia Plan  ASA: II  Anesthesia Plan: General   Post-op Pain Management:    Induction: Intravenous  Airway Management Planned: LMA  Additional Equipment:   Intra-op Plan:   Post-operative Plan: Extubation in OR  Informed Consent: I have reviewed the patients History and Physical, chart, labs and discussed the procedure including the risks, benefits and alternatives for the proposed anesthesia with the patient or authorized representative who has indicated his/her understanding and acceptance.     Plan Discussed with: CRNA and Surgeon  Anesthesia Plan Comments:         Anesthesia Quick Evaluation  

## 2014-02-12 NOTE — Transfer of Care (Signed)
Immediate Anesthesia Transfer of Care Note  Patient: Louis Mccullough  Procedure(s) Performed: Procedure(s): BILATERAL CARPAL TUNNEL RELEASE (Bilateral)  Patient Location: PACU  Anesthesia Type:General  Level of Consciousness: awake and sedated  Airway & Oxygen Therapy: Patient Spontanous Breathing and Patient connected to face mask oxygen  Post-op Assessment: Report given to PACU RN and Post -op Vital signs reviewed and stable  Post vital signs: Reviewed and stable  Complications: No apparent anesthesia complications

## 2014-02-12 NOTE — H&P (Signed)
Louis Mccullough is a 58 year old right hand dominant male with left hand numbness and tingling, to a lesser extent in his right hand. This has been going on for approximately one year. It awakens him at night occasionally. He has no history of injury to his hand. He has had a C5/6 fusion done for a disc by Dr. Carloyn Manner 10 years ago. He sleeps in a splint which has given him some relief. He saw Dr. Carloyn Manner recently who felt it was not coming from his neck. He has a history of thyroid problems, arthritis and gout. There is a family history of arthritis and gout. He has been wearing a brace. He states waking up in the middle of the night will cause him to frequently shake his hand to improve this. He is complaining primarily of numbness and to a lesser extent pain. He has had his nerve conductions done revealing bilateral carpal tunnel syndrome with motor delay of 6.3 and 5+, sensory delay of 3.0 bilaterally. He also has significant amplitude diminution to 7.7 on the right side.   PAST MEDICAL HISTORY: He has no known drug allergies. He is on the following medications: HCTZ, aspirin, Telmisartan, Allopurinol, levothyroxine, omeprazole, amlodipine, fenofibrate and simvastatin. He has had a tonsillectomy, cholecystectomy, rotator cuff repair, ruptured disc in his neck with fusion.  FAMILY H ISTORY: Positive for heart disease, high BP and arthritis.  SOCIAL HISTORY: He smokes occasional and is advised to quit and the reasons behind this. He drinks socially. He is married. 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   REVIEW OF SYSTEMS: Positive for glasses, hearing loss, high BP, otherwise negative for 14 points Louis Mccullough is an 58 y.o. male.   Chief Complaint: bilaateral carpal tunnel syndrome HPI: see above  Past Medical History  Diagnosis Date  . HTN (hypertension)   . Seasonal allergies   . Hypothyroidism   . Gout   . Hypertriglyceridemia   . Hyperlipidemia   . GERD (gastroesophageal reflux  disease)     states he had previous EGD  . Hx of adenomatous colonic polyps   . Wears glasses   . History of bronchitis     Past Surgical History  Procedure Laterality Date  . Rotator cuff repair  2006    righrt  . Neck surgery  2001    cerv fusion  . Cholecystectomy    . Tonsillectomy    . Colonoscopy  2006    adenomatous polyp, diverticulosis  . Colonoscopy  09/29/2010    Procedure: COLONOSCOPY;  Surgeon: Daneil Dolin, MD;  [facility];  Service: Endoscopy;  Laterality: N/A;    Family History  Problem Relation Age of Onset  . Colon cancer Maternal Grandfather 58    died age 62  . Prostate cancer Father 54    died age 30   Social History:  reports that he has been smoking Cigarettes.  He has been smoking about 0.30 packs per day. He does not have any smokeless tobacco history on file. He reports that he drinks alcohol. He reports that he does not use illicit drugs.  Allergies: No Known Allergies  Medications Prior to Admission  Medication Sig Dispense Refill  . allopurinol (ZYLOPRIM) 300 MG tablet Take 300 mg by mouth daily.      Marland Kitchen amLODipine (NORVASC) 10 MG tablet Take 10 mg by mouth daily.      Marland Kitchen aspirin 81 MG tablet Take 81 mg by mouth daily.    Marland Kitchen  fluticasone (FLONASE) 50 MCG/ACT nasal spray     . MICARDIS 80 MG tablet     . omeprazole (PRILOSEC) 20 MG capsule Take 20 mg by mouth daily.      . simvastatin (ZOCOR) 20 MG tablet Take 20 mg by mouth at bedtime.      Marland Kitchen SYNTHROID 100 MCG tablet     . TRICOR 145 MG tablet     . fish oil-omega-3 fatty acids 1000 MG capsule Take 2 g by mouth daily.        No results found for this or any previous visit (from the past 48 hour(s)).  No results found.   Pertinent items are noted in HPI.  Blood pressure 149/85, pulse 82, temperature 98.2 F (36.8 C), temperature source Oral, resp. rate 18, height 5\' 9"  (1.753 m), weight 102.57 kg (226 lb 2 oz), SpO2 98 %.  General appearance: alert, cooperative and appears  stated age Head: Normocephalic, without obvious abnormality Neck: no JVD Resp: clear to auscultation bilaterally Cardio: regular rate and rhythm, S1, S2 normal, no murmur, click, rub or gallop GI: soft, non-tender; bowel sounds normal; no masses,  no organomegaly Extremities: bilateral numbness and tingling median nerve distribution Pulses: 2+ and symmetric Skin: Skin color, texture, turgor normal. No rashes or lesions Neurologic: Grossly normal Incision/Wound: na  Assessment/Plan X-rays of his hand reveals degenerative changes at the [facility] joint left thumb, otherwise negative.  Diagnosis: (1) Cervical spondylosis s/p fusion. (2) Degenerative arthritis left thumb [facility] joint asymptomatic. (3) Probable carpal tunnel syndrome. He has elected to undergo surgical release and would like to have both sides done at the same time. The pre, peri and post op course are discussed along with risks and complications.  He is aware there is no guarantee with surgery, possibility of infection, recurrence, injury to arteries, nerves and tendons, incomplete relief of symptoms and dystrophy.  He is scheduled for bilateral carpal tunnel release but may change this to unilateral at his discretion. This will be scheduled under regional anesthesia.  [name] R 02/12/2014, 12:39 PM

## 2014-02-12 NOTE — Discharge Instructions (Addendum)

## 2014-02-12 NOTE — Anesthesia Postprocedure Evaluation (Signed)
Anesthesia Post Note  Patient: Louis Mccullough  Procedure(s) Performed: Procedure(s) (LRB): BILATERAL CARPAL TUNNEL RELEASE (Bilateral)  Anesthesia type: general  Patient location: PACU  Post pain: Pain level controlled  Post assessment: Patient's Cardiovascular Status Stable  Last Vitals:  Filed Vitals:   02/12/14 1443  BP: 145/80  Pulse: 78  Temp: 36.8 C  Resp: 18    Post vital signs: Reviewed and stable  Level of consciousness: sedated  Complications: No apparent anesthesia complications

## 2014-02-12 NOTE — Op Note (Signed)
Dictation Number [id number]

## 2014-02-12 NOTE — Brief Op Note (Signed)
02/12/2014  1:49 PM  PATIENT:  Louis Mccullough  58 y.o. male  PRE-OPERATIVE DIAGNOSIS:  bilateral carpal tunnel syndrome  POST-OPERATIVE DIAGNOSIS:  bilateral carpal tunnel syndrome  PROCEDURE:  Procedure(s): BILATERAL CARPAL TUNNEL RELEASE (Bilateral)  SURGEON:  Surgeon(s) and Role:    * Daryll Brod, MD - Primary  PHYSICIAN ASSISTANT:   ASSISTANTS: none   ANESTHESIA:   local and general  EBL:  Total I/O In: 800 [I.V.:800] Out: -   BLOOD ADMINISTERED:none  DRAINS: none   LOCAL MEDICATIONS USED:  BUPIVICAINE   SPECIMEN:  No Specimen  DISPOSITION OF SPECIMEN:  N/A  COUNTS:  YES  TOURNIQUET:   Total Tourniquet Time Documented: Upper Arm (Right) - 18 minutes Total: Upper Arm (Right) - 18 minutes  Forearm (Left) - 19 minutes Total: Forearm (Left) - 19 minutes   DICTATION: .Other Dictation: Dictation Number [id number]  PLAN OF CARE: Discharge to home after PACU  PATIENT DISPOSITION:  PACU - hemodynamically stable.

## 2014-02-13 NOTE — Op Note (Signed)
NAME:  Louis Mccullough, Louis Mccullough NO.:  [id number]  MEDICAL RECORD NO.:  [id number]  LOCATION:                                 FACILITY:  PHYSICIAN:  Daryll Brod, M.D.            DATE OF BIRTH:  DATE OF PROCEDURE:  02/12/2014 DATE OF DISCHARGE:                              OPERATIVE REPORT   PREOPERATIVE DIAGNOSIS:  Bilateral carpal tunnel syndrome.  POSTOPERATIVE DIAGNOSIS:  Bilateral carpal tunnel syndrome.  OPERATION:  Release right carpal canal.  Release left carpal canal.  SURGEON:  Daryll Brod, MD.  ANESTHESIA:  General with local infiltration.  ANESTHESIOLOGIST:  Crissie Sickles. [name], MD.  HISTORY:  The patient is a 58 year old male with a history of bilateral carpal tunnel syndrome, nerve conduction is positive.  This has not responded to conservative treatment.  He has elected to undergo release of both sides same time.  Pre, peri, and postoperative courses have been discussed along with risks and complications.  He is aware that there is no guarantee with the surgery, possibility of infection; recurrence of injury to arteries, nerves, tendons, incomplete relief of symptoms, dystrophy.  In preoperative area, the patient is seen, the extremity marked by both patient and surgeon.  Antibiotic given.  PROCEDURE IN DETAIL:  The patient was brought to the operating room, where a general anesthetic was carried out without difficulty under the direction of Dr. Conrad [place], who was prepped using ChloraPrep, supine position with the right arm free.  A 3-minute dry time was allowed. Time-out taken confirming the patient and procedure.  The right arm was exsanguinated with an Esmarch bandage.  Tourniquet placed on the upper arm that was inflated to 250 mmHg.  A longitudinal incision was made in the right palm, carried down through subcutaneous tissue.  Bleeders were electrocauterized.  Palmar fascia was split.  Superficial palmar arch identified.  The flexor tendon to the ring and  little finger identified to the ulnar side of median nerve.  The carpal retinaculum was incised with sharp dissection.  Right angle and Sewall retractors were placed between skin and forearm fascia.  Fascia was released for approximately 2 cm proximal to the wrist crease under direct vision.  Canal was explored.  A very significant compression to the nerve was apparent with hyperemia along the course of motor branch entered into muscle.  No further lesions were identified.  The wound was copiously irrigated with saline and the skin was closed with subcuticular 4-0 chromic sutures. Steri-Strips were applied over benzoin.  A sterile compressive dressing with fingers free was applied.  On deflation of the tourniquet, all fingers immediately pinked.  The left side was then prepped and draped. A forearm tourniquet had been applied.  A time-out taken confirming patient and procedure.  The left side was exsanguinated.  The forearm tourniquet placed was inflated to 250 mmHg.  A longitudinal incision was made in the left palm, carried down through subcutaneous tissue.  The palmar fascia was split.  Superficial palmar arch identified.  Flexor tendon to the ring and little finger identified to the ulnar side of median nerve.  Carpal retinaculum was incised  with sharp dissection. Right angle and Sewall retractors were placed between skin and forearm fascia.  Fascia was released for approximately 2 cm proximal to the wrist crease under direct vision on the left side.  The motor branch was noted to enter into the muscle.  Again, air compression to the nerve was apparent with a significant area of hyperemia.  Wound was copiously irrigated with saline and the skin closed with subcuticular 4-0 chromic sutures.  Benzoin and Steri-Strips were applied.  A sterile compressive dressing was applied.  On deflation of the tourniquet, all fingers pinked.  He was taken to the recovery room for observation  in satisfactory condition.  He will be discharged home to return to the [facility] in 1 week on Vicodin.          ______________________________ Daryll Brod, M.D.     GK/MEDQ  D:  02/12/2014  T:  02/13/2014  Job:  [id number]

## 2014-02-15 ENCOUNTER — Encounter (HOSPITAL_BASED_OUTPATIENT_CLINIC_OR_DEPARTMENT_OTHER): Payer: Self-pay | Admitting: Orthopedic Surgery

## 2014-02-15 LAB — POCT I-STAT, CHEM 8
BUN: 18 mg/dL (ref 6–23)
CREATININE: 1 mg/dL (ref 0.50–1.35)
Calcium, Ion: 1.19 mmol/L (ref 1.12–1.23)
Chloride: 103 mEq/L (ref 96–112)
GLUCOSE: 135 mg/dL — AB (ref 70–99)
HCT: 46 % (ref 39.0–52.0)
Hemoglobin: 15.6 g/dL (ref 13.0–17.0)
Potassium: 3.8 mEq/L (ref 3.7–5.3)
SODIUM: 140 meq/L (ref 137–147)
TCO2: 23 mmol/L (ref 0–100)

## 2015-05-16 ENCOUNTER — Ambulatory Visit (HOSPITAL_COMMUNITY): Payer: BLUE CROSS/BLUE SHIELD | Attending: Orthopaedic Surgery | Admitting: Occupational Therapy

## 2015-05-16 ENCOUNTER — Encounter (HOSPITAL_COMMUNITY): Payer: Self-pay | Admitting: Occupational Therapy

## 2015-05-16 DIAGNOSIS — M7542 Impingement syndrome of left shoulder: Secondary | ICD-10-CM | POA: Insufficient documentation

## 2015-05-16 DIAGNOSIS — M25512 Pain in left shoulder: Secondary | ICD-10-CM | POA: Insufficient documentation

## 2015-05-16 NOTE — Patient Instructions (Signed)
Strengthening: Chest Pull - Resisted   Hold Theraband in front of body with hands about shoulder width a part. Pull band a part and back together slowly. Repeat _10-15___ times. Repeat __1-2__ session(s) per day.  http://orth.exer.us/926   Copyright  VHI. All rights reserved.   PNF Strengthening: Resisted   Standing with resistive band around each hand, bring right arm up and away, thumb back. Repeat _10-15___ times per set.  Do _1-2___ sessions per day.  http://orth.exer.us/918   Copyright  VHI. All rights reserved.   PNF Strengthening: Resisted   Standing with resistive band around each hand, bring right arm up and across body. Repeat _10-15___ times per set. Do _1-2___ sessions per day.  http://orth.exer.us/920   Copyright  VHI. All rights reserved.    Resisted External Rotation: in Neutral - Bilateral   Sit or stand, tubing in both hands, elbows at sides, bent to 90, forearms forward. Pinch shoulder blades together and rotate forearms out. Keep elbows at sides. Repeat _10-15___ times per set.  Do _1-2___ sessions per day.  http://orth.exer.us/966   Copyright  VHI. All rights reserved.   PNF Strengthening: Resisted   Standing, hold resistive band above head. Bring right arm down and out from side. Repeat _10-15___ times per set.  Do _1-2___ sessions per day.  http://orth.exer.us/922   Copyright  VHI. All rights reserved.        1) Shoulder Protraction    Begin with elbows by your side, slowly "punch" straight out in front of you keeping arms/elbows straight.      2) Shoulder Flexion  Supine:     Standing:         Begin with arms at your side with thumbs pointed up, slowly raise both arms up and forward towards overhead.    3) Horizontal abduction/adduction  Supine:   Standing:           Begin with arms straight out in front of you, bring out to the side in at "T" shape. Keep arms straight entire time.    4) Internal &  External Rotation    *No band* -Stand with elbows at the side and elbows bent 90 degrees. Move your forearms away from your body, then bring back inward toward the body.     5) Shoulder Abduction  Supine:     Standing:       Lying on your back begin with your arms flat on the table next to your side. Slowly move your arms out to the side so that they go overhead, in a jumping jack or snow angel movement.    6) X to V arms (cheerleader move):  Begin with arms straight down, crossed in front of body in an "X". Keeping arms crossed, lift arms straight up overhead. Then spread arms apart into a "V" shape.  Bring back together into x and lower down to starting position.    7) W arms:  Begin with elbows bent and even with shoulders, hands pointing to ceiling. Keeping elbows at shoulder level, 1-shrug shoulders up, 2-squeeze shoulder blades together, and 3-relax.    Repeat all exercises 10-15 times, 1-2 times per day.        (Home) Extension: Isometric / Bilateral Arm Retraction - Sitting   Facing anchor, hold hands and elbow at shoulder height, with elbow bent.  Pull arms back to squeeze shoulder blades together. Repeat 10-15 times.  Copyright  VHI. All rights reserved.   (Home) Retraction: Row - Bilateral Engineer, maintenance (IT))  Facing anchor, arms reaching forward, pull hands toward stomach, keeping elbows bent and at your sides and pinching shoulder blades together. Repeat 10-15 times.  Copyright  VHI. All rights reserved.   (Clinic) Extension / Flexion (Assist)   Face anchor, pull arms back, keeping elbow straight, and squeze shoulder blades together. Repeat 10-15 times.   Copyright  VHI. All rights reserved.

## 2015-05-16 NOTE — Therapy (Signed)
[place], [place], [address] Phone: [phone]   Fax:  [phone]  Occupational Therapy Evaluation  Patient Details  Name: Louis Mccullough MRN: [id number] Date of Birth: 20-Aug-1955 Referring Provider: Dr. Joni Fears  Encounter Date: 05/16/2015      OT End of Session - 05/16/15 1606    Visit Number 1   Number of Visits 1   Date for OT Re-Evaluation 05/17/15   OT Start Time [id number]   OT Stop Time 1554   OT Time Calculation (min) 36 min   Activity Tolerance Patient tolerated treatment well   Behavior During Therapy [facility] for tasks assessed/performed      Past Medical History  Diagnosis Date  . HTN (hypertension)   . Seasonal allergies   . Hypothyroidism   . Gout   . Hypertriglyceridemia   . Hyperlipidemia   . GERD (gastroesophageal reflux disease)     states he had previous EGD  . Hx of adenomatous colonic polyps   . Wears glasses   . History of bronchitis     Past Surgical History  Procedure Laterality Date  . Rotator cuff repair  2006    righrt  . Neck surgery  2001    cerv fusion  . Cholecystectomy    . Tonsillectomy    . Colonoscopy  2006    adenomatous polyp, diverticulosis  . Colonoscopy  09/29/2010    Procedure: COLONOSCOPY;  Surgeon: Daneil Dolin, MD;  [facility];  Service: Endoscopy;  Laterality: N/A;  . Bilateral carpal tunnel release Bilateral 02/12/2014    Procedure: BILATERAL CARPAL TUNNEL RELEASE;  Surgeon: Daryll Brod, MD;  Location: [facility];  Service: Orthopedics;  Laterality: Bilateral;    There were no vitals filed for this visit.  Visit Diagnosis:  Shoulder impingement, left  Pain in left shoulder      Subjective Assessment - 05/16/15 1603    Subjective  S: It's really stiff and sore when I wake up in the mornings.    Pertinent History Pt is a 60 y/o male presenting with left shoulder impingement syndrome that began approximately 3 months  ago after moving heavy items including salt blocks and bags of corn. Pt reports he takes low dose medication for pain which has helped. Dr. Joni Fears referred pt to occupational therapy for evaluation and HEP.    Patient Stated Goals To have less pain in my arm.    Currently in Pain? No/denies           [facility] OT Assessment - 05/16/15 1510    Assessment   Diagnosis left impingment syndrome   Referring Provider Dr. Joni Fears   Onset Date 02/15/16   Prior Therapy none   Precautions   Precautions None   Restrictions   Weight Bearing Restrictions No   Balance Screen   Has the patient fallen in the past 6 months No   Has the patient had a decrease in activity level because of a fear of falling?  No   Is the patient reluctant to leave their home because of a fear of falling?  No   Prior Function   Level of Independence Independent with basic ADLs   Vocation Full time employment   Vocation Requirements desk work   Leisure farm work, Publishing copy work, fishing, Location manager, lifting/reaching/pulling   ADL   ADL comments Pt is having difficulty with lifting, pulling, & heavy work. Pt is able to complete ADLs,  however may have increased pain. Pt has weakness on awakening and uses RUE as assist to stretch arm out. Resolves shortly after awakening   Written Expression   Dominant Hand Right   Cognition   Overall Cognitive Status Within Functional Limits for tasks assessed   ROM / Strength   AROM / PROM / Strength AROM;Strength   AROM   Overall AROM Comments Assessed seated, ER/IR adducted   AROM Assessment Site Shoulder   Right/Left Shoulder Left   Left Shoulder Flexion 167 Degrees   Left Shoulder ABduction 167 Degrees   Left Shoulder Internal Rotation 90 Degrees   Left Shoulder External Rotation 52 Degrees   Strength   Overall Strength Comments Assessed seated, ER/IR adducted   Strength Assessment Site Shoulder   Right/Left Shoulder Left   Left Shoulder Flexion 5/5   Left  Shoulder ABduction 5/5   Left Shoulder Internal Rotation 5/5   Left Shoulder External Rotation 5/5                         OT Education - 05/16/15 1535    Education provided Yes   Education Details A/ROM exercises, scapular theraband exercises, green theraband strengthening exercises   Person(s) Educated Patient   Methods Explanation;Demonstration;Handout   Comprehension Verbalized understanding;Returned demonstration          OT Short Term Goals - 05/16/15 1609    OT SHORT TERM GOAL #1   Title Pt will be educated on and independent in HEP.    Time 1   Period Days   Status Achieved                  Plan - 05/16/15 1606    Clinical Impression Statement A: Pt is a 60 y/o male presenting with left shoulder impingment syndrome that is causing increased pain in the left shoulder, limiting his ability to complete daily and leisure tasks at his prior level of functioning. Pt is able to complete ADL and lesiure tasks, however experiences pain and soreness. Pt educated on HEP including A/ROM exercises, scapular theraband, and green theraband strengthening exercises.    Pt will benefit from skilled therapeutic intervention in order to improve on the following deficits (Retired) Pain;Impaired UE functional use   Rehab Potential Good   OT Frequency 1x / week   OT Duration --  1 visit   OT Treatment/Interventions Patient/family education   Plan P: Pt educated on HEP per MD order. HEP includes green theraband strengthening exercises, green scapular theraband exercises, A/ROM exercises. Pt demonstrates good form and comprehension of HEP.    OT Home Exercise Plan HEP-A/ROM, scapular theraband, therband strengthening   Consulted and Agree with Plan of Care Patient        Problem List Patient Active Problem List   Diagnosis Date Noted  . GERD (gastroesophageal reflux disease) 08/29/2010  . Hx of adenomatous colonic polyps 08/29/2010  . FHx: colon cancer  08/29/2010    Guadelupe Mccullough, OTR/L  [phone]  05/16/2015, 4:11 PM  [place] [address] [place], [place], [address] Phone: [phone]   Fax:  [phone]  Name: Louis Mccullough MRN: [id number] Date of Birth: May 20, 1955

## 2015-08-17 ENCOUNTER — Encounter: Payer: Self-pay | Admitting: Internal Medicine

## 2016-07-04 ENCOUNTER — Ambulatory Visit (HOSPITAL_COMMUNITY)
Admission: RE | Admit: 2016-07-04 | Discharge: 2016-07-04 | Disposition: A | Discharge status: Home / Self Care | Payer: BLUE CROSS/BLUE SHIELD | Source: Ambulatory Visit | Attending: Internal Medicine | Admitting: Internal Medicine

## 2016-07-04 ENCOUNTER — Other Ambulatory Visit (HOSPITAL_COMMUNITY): Payer: Self-pay | Admitting: Internal Medicine

## 2016-07-04 DIAGNOSIS — R059 Cough, unspecified: Secondary | ICD-10-CM

## 2016-07-04 DIAGNOSIS — R05 Cough: Secondary | ICD-10-CM | POA: Diagnosis not present

## 2017-03-21 MED FILL — SIMVASTATIN 20 MG TABLET: 20 | 90 days supply | Qty: 90 | Fill #0

## 2017-03-25 MED FILL — HYDROCHLOROTHIAZIDE 25 MG T: 25 | 90 days supply | Qty: 90 | Fill #0

## 2017-03-25 MED FILL — METHOCARBAMOL 500 MG TABS: 500 | 30 days supply | Qty: 90 | Fill #0

## 2017-03-26 MED FILL — DICLOFENAC SOD EC 50 MG TAB: 50 | 30 days supply | Qty: 90 | Fill #0

## 2017-05-30 ENCOUNTER — Encounter: Payer: Self-pay | Admitting: Internal Medicine

## 2017-06-11 MED FILL — TELMISARTAN 80 MG TABS: 80 | 90 days supply | Qty: 90 | Fill #0

## 2017-06-11 MED FILL — METHOCARBAMOL 500 MG TABS: 500 | 30 days supply | Qty: 90 | Fill #1

## 2017-06-11 MED FILL — ALLOPURINOL 300 MG TABLET: 300 | 90 days supply | Qty: 90 | Fill #0

## 2017-06-11 MED FILL — FLUTICASONE PROP 50 MCG SPR: 50 | 90 days supply | Qty: 48 | Fill #0

## 2017-06-11 MED FILL — LEVOTHYROXINE 112 MCG TAB: 112 | 90 days supply | Qty: 90 | Fill #0

## 2017-06-11 MED FILL — AMLODIPINE BESYLATE 10 MG T: 10 | 90 days supply | Qty: 90 | Fill #0

## 2017-06-12 MED FILL — PANTOPRAZOLE SOD DR 40 MG T: 40 | 90 days supply | Qty: 90 | Fill #0

## 2017-06-12 MED FILL — DICLOFENAC SOD EC 50 MG TAB: 50 | 30 days supply | Qty: 90 | Fill #0

## 2017-06-24 ENCOUNTER — Ambulatory Visit (INDEPENDENT_AMBULATORY_CARE_PROVIDER_SITE_OTHER): Payer: No Typology Code available for payment source

## 2017-06-24 DIAGNOSIS — Z86010 Personal history of colonic polyps: Secondary | ICD-10-CM

## 2017-06-24 MED ORDER — NA SULFATE-K SULFATE-MG SULF 17.5-3.13-1.6 GM/177ML PO SOLN: 1.0000 | ORAL | 0 refills | Status: DC

## 2017-06-24 MED FILL — SUPREP BOWEL PREP KIT: 17.5-3.13-1 | 1 days supply | Qty: 354 | Fill #0

## 2017-06-24 NOTE — Progress Notes (Signed)
Gastroenterology Pre-Procedure Review  Request Date:06/24/17 Requesting Physician: Dr.Fagan (last tcs 09/29/10 RMR- tubular adenoma)  PATIENT REVIEW QUESTIONS: The patient responded to the following health history questions as indicated:    1. Diabetes Melitis: no 2. Joint replacements in the past 12 months: no 3. Major health problems in the past 3 months: no 4. Has an artificial valve or MVP: no 5. Has a defibrillator: no 6. Has been advised in past to take antibiotics in advance of a procedure like teeth cleaning: no 7. Family history of colon cancer: no  8. Alcohol Use: yes (daily- beer a couple a day ) 9. History of sleep apnea: no  10. History of coronary artery or other vascular stents placed within the last 12 months: no 11. History of any prior anesthesia complications: no    MEDICATIONS & ALLERGIES:    Patient reports the following regarding taking any blood thinners:   Plavix? no Aspirin? yes (81mg ) Coumadin? no Brilinta? no Xarelto? no Eliquis? no Pradaxa? no Savaysa? no Effient? no  Patient confirms/reports the following medications:  Current Outpatient Medications  Medication Sig Dispense Refill  . allopurinol (ZYLOPRIM) 300 MG tablet Take 300 mg by mouth daily.      Marland Kitchen amLODipine (NORVASC) 10 MG tablet Take 10 mg by mouth daily.      Marland Kitchen aspirin 81 MG tablet Take 81 mg by mouth daily.    . diclofenac (VOLTAREN) 50 MG EC tablet Take 50 mg by mouth 3 (three) times daily.  3  . fish oil-omega-3 fatty acids 1000 MG capsule Take 2 g by mouth daily.      . fluticasone (FLONASE) 50 MCG/ACT nasal spray     . hydrochlorothiazide (HYDRODIURIL) 25 MG tablet Take 25 mg by mouth daily.    Marland Kitchen levothyroxine (SYNTHROID, LEVOTHROID) 112 MCG tablet Take 112 mcg by mouth daily.  4  . methocarbamol (ROBAXIN) 500 MG tablet Take 500 mg by mouth 3 (three) times daily.  4  . MICARDIS 80 MG tablet     . pantoprazole (PROTONIX) 40 MG tablet Take 40 mg by mouth daily.  4  . simvastatin  (ZOCOR) 20 MG tablet Take 20 mg by mouth at bedtime.      . TRICOR 145 MG tablet      No current facility-administered medications for this visit.     Patient confirms/reports the following allergies:  No Known Allergies  No orders of the defined types were placed in this encounter.   AUTHORIZATION INFORMATION Primary Insurance: [employer] ([place]),  ID #: [id number] Pre-Cert / Josem Kaufmann required:  Pre-Cert / Auth #:   SCHEDULE INFORMATION: Procedure has been scheduled as follows:  Date: 08/30/17, Time:  9:30 Location: [facility] Dr.Rourk  This Gastroenterology Pre-Precedure Review Form is being routed to the following provider(s): Walden Field NP

## 2017-06-24 NOTE — Patient Instructions (Signed)
Louis Mccullough  17-Oct-1955 MRN: [id number]     Procedure Date: 08/30/17 Time to register: 8:30 Place to register: [name] Procedure Time: 9:30 Scheduled provider: R. Garfield Cornea, MD    PREPARATION FOR COLONOSCOPY WITH SUPREP BOWEL PREP KIT  Note: Suprep Bowel Prep Kit is a split-dose (2day) regimen. Consumption of BOTH 6-ounce bottles is required for a complete prep.  Please notify us immediately if you are diabetic, take iron supplements, or if you are on Coumadin or any other blood thinners.                                                                                                                                                    2 DAYS BEFORE PROCEDURE:  DATE: 08/28/17   DAY: Wednesday Begin clear liquid diet AFTER your lunch meal. NO SOLID FOODS after this point.  1 DAY BEFORE PROCEDURE:  DATE: 08/29/17   DAY: Thursday Continue clear liquids the entire day - NO SOLID FOOD.     At 6:00pm: Complete steps 1 through 4 below, using ONE (1) 6-ounce bottle, before going to bed. Step 1:  Pour ONE (1) 6-ounce bottle of SUPREP liquid into the mixing container.  Step 2:  Add cool drinking water to the 16 ounce line on the container and mix.  Note: Dilute the solution concentrate as directed prior to use. Step 3:  DRINK ALL the liquid in the container. Step 4:  You MUST drink an additional two (2) or more 16 ounce containers of water  over the next one (1) hour.   Continue clear liquids only, until midnight. Do not eat or drink anything after midnight. EXCEPTION: If you take medications for your heart, blood pressure, or breathing, you may take these medications with a small amount of clear liquid.   DAY OF PROCEDURE:   DATE: 08/30/17   DAY: Friday   5 hours before your procedure at 4:30am: Step 1:  Pour ONE (1) 6-ounce bottle of SUPREP liquid into the mixing container.  Step 2:  Add cool drinking water to the 16 ounce line on the container and mix.  Note: Dilute  the solution concentrate as directed prior to use. Step 3:  DRINK ALL the liquid in the container. Step 4:  You MUST drink an additional two (2) or more 16 ounce containers of water over the next one (1) hour. You MUST complete the final glass of water at least 3 hours before your colonoscopy.   Nothing by mouth past 7:30am  You may take your morning medications with sip of water unless we have instructed otherwise.    Please see below for Dietary Information.  CLEAR LIQUIDS INCLUDE:  Water Jello (NOT red in color)   Ice Popsicles (NOT red in color)   Tea (sugar ok, no milk/cream) Powdered fruit flavored drinks  Coffee (sugar ok, no milk/cream) Gatorade/ Lemonade/ Kool-Aid  (NOT red in color)   Juice: apple, white grape, white cranberry Soft drinks  Clear bullion, consomme, broth (fat free beef/chicken/vegetable)  Carbonated beverages (any kind)  Strained chicken noodle soup Hard Candy   Remember: Clear liquids are liquids that will allow you to see your fingers on the other side of a clear glass. Be sure liquids are NOT red in color, and not cloudy, but CLEAR.  DO NOT EAT OR DRINK ANY OF THE FOLLOWING:  Dairy products of any kind   Cranberry juice Tomato juice / V8 juice   Grapefruit juice Orange juice     Red grape juice  Do not eat any solid foods, including such foods as: cereal, oatmeal, yogurt, fruits, vegetables, creamed soups, eggs, bread, crackers, pureed foods in a blender, etc.   HELPFUL HINTS FOR DRINKING PREP SOLUTION:   Make sure prep is extremely cold. Mix and refrigerate the the morning of the prep. You may also put in the freezer.   You may try mixing some Crystal Light or Country Time Lemonade if you prefer. Mix in small amounts; add more if necessary.  Try drinking through a straw  Rinse mouth with water or a mouthwash between glasses, to remove after-taste.  Try sipping on a cold beverage /ice/ popsicles between glasses of prep.  Place a piece of  sugar-free hard candy in mouth between glasses.  If you become nauseated, try consuming smaller amounts, or stretch out the time between glasses. Stop for 30-60 minutes, then slowly start back drinking.     OTHER INSTRUCTIONS  You will need a responsible adult at least 63 years of age to accompany you and drive you home. This person must remain in the waiting room during your procedure. The hospital will cancel your procedure if you do not have a responsible adult with you.   1. Wear loose fitting clothing that is easily removed. 2. Leave jewelry and other valuables at home.  3. Remove all body piercing jewelry and leave at home. 4. Total time from sign-in until discharge is approximately 2-3 hours. 5. You should go home directly after your procedure and rest. You can resume normal activities the day after your procedure. 6. The day of your procedure you should not:  Drive  Make legal decisions  Operate machinery  Drink alcohol  Return to work   You may call the office (Dept: [phone]) before 5:00pm, or page the doctor on call [phone]) after 5:00pm, for further instructions, if necessary.   Insurance Information YOU WILL NEED TO CHECK WITH YOUR INSURANCE COMPANY FOR THE BENEFITS OF COVERAGE YOU HAVE FOR THIS PROCEDURE.  UNFORTUNATELY, NOT ALL INSURANCE COMPANIES HAVE BENEFITS TO COVER ALL OR PART OF THESE TYPES OF PROCEDURES.  IT IS YOUR RESPONSIBILITY TO CHECK YOUR BENEFITS, HOWEVER, WE WILL BE GLAD TO ASSIST YOU WITH ANY CODES YOUR INSURANCE COMPANY MAY NEED.    PLEASE NOTE THAT MOST INSURANCE COMPANIES WILL NOT COVER A SCREENING COLONOSCOPY FOR PEOPLE UNDER THE AGE OF 50  IF YOU HAVE BCBS INSURANCE, YOU MAY HAVE BENEFITS FOR A SCREENING COLONOSCOPY BUT IF POLYPS ARE FOUND THE DIAGNOSIS WILL CHANGE AND THEN YOU MAY HAVE A DEDUCTIBLE THAT WILL NEED TO BE MET. SO PLEASE MAKE SURE YOU CHECK YOUR BENEFITS FOR A SCREENING COLONOSCOPY AS WELL AS A DIAGNOSTIC  COLONOSCOPY.

## 2017-06-25 NOTE — Progress Notes (Signed)
Will need OV for consideration of propofol.

## 2017-06-26 NOTE — Progress Notes (Signed)
Please schedule ov and let me know, I will mail him a letter.

## 2017-06-27 NOTE — Progress Notes (Signed)
PATIENT SCHEDULED FOR 08/14/17 AT 2:00

## 2017-07-02 NOTE — Progress Notes (Signed)
Letter mailed to the pt. pts wife is also aware.

## 2017-07-08 MED FILL — HYDROCHLOROTHIAZIDE 25 MG T: 25 | 90 days supply | Qty: 90 | Fill #1

## 2017-08-14 ENCOUNTER — Telehealth: Payer: Self-pay | Admitting: *Deleted

## 2017-08-14 ENCOUNTER — Other Ambulatory Visit: Payer: Self-pay | Admitting: *Deleted

## 2017-08-14 ENCOUNTER — Encounter: Payer: Self-pay | Admitting: *Deleted

## 2017-08-14 ENCOUNTER — Encounter: Payer: Self-pay | Admitting: Nurse Practitioner

## 2017-08-14 ENCOUNTER — Ambulatory Visit (INDEPENDENT_AMBULATORY_CARE_PROVIDER_SITE_OTHER): Payer: No Typology Code available for payment source | Admitting: Nurse Practitioner

## 2017-08-14 VITALS — BP 134/79 | HR 73 | Temp 97.4°F | Ht 68.0 in | Wt 208.0 lb

## 2017-08-14 DIAGNOSIS — F1010 Alcohol abuse, uncomplicated: Secondary | ICD-10-CM | POA: Insufficient documentation

## 2017-08-14 DIAGNOSIS — Z1211 Encounter for screening for malignant neoplasm of colon: Secondary | ICD-10-CM | POA: Diagnosis not present

## 2017-08-14 NOTE — Progress Notes (Signed)
CC'D TO PCP °

## 2017-08-14 NOTE — Patient Instructions (Signed)
1. We will schedule your colonoscopy for you. 2. Further recommendations will be made after your colonoscopy. 3. Follow-up based on recommendations made after your colonoscopy. 4. Call us if you have any questions or concerns.  At [facility] we value your feedback. You may receive a survey about your visit today. Please share your experience as we strive to create trusting relationships with our patients to provide genuine, compassionate, quality care.  It was great to meet you!  I hope you have a great summer!!

## 2017-08-14 NOTE — Assessment & Plan Note (Signed)
The patient is overdue for surveillance colonoscopy.  Last colonoscopy in 2012 with multiple colon polyps found to be tubular adenoma.  Recommended 5-year repeat and he was doing 2017.  Currently asymptomatic from GI standpoint.  He was brought in for office visit due to daily alcohol use and need for augmented sedation.  No overt contraindications to procedure and we will proceed at this time.  Proceed with TCS on propofol/MAC with Dr. Gala Romney in near future: the risks, benefits, and alternatives have been discussed with the patient in detail. The patient states understanding and desires to proceed.  The patient is not on any anticoagulants, anxiolytics, chronic pain medications, or antidepressants.  He drinks 2-3 drinks, either beer or liquor, a day.  Denies recreational drug use.  Due to daily alcohol intake we will plan for the procedure on propofol/MAC.

## 2017-08-14 NOTE — Assessment & Plan Note (Signed)
The patient drinks alcohol daily.  He has about 2-3 drinks a day.  This is been ongoing for years.  He does not show signs of dependence.  However, due to daily alcohol we will plan for the procedure on propofol/MAC as per above.  Follow-up based on post procedure recommendations.

## 2017-08-14 NOTE — Progress Notes (Signed)
Primary Care Physician:  Asencion Noble, MD Primary Gastroenterologist:  Dr. Gala Romney  Chief Complaint  Patient presents with  . Colonoscopy    consult    HPI:   Louis Mccullough is a 62 y.o. male who presents on referral for repeat colonoscopy with a personal history of colon polyps. Nurse/phone triage was deferred to office visit due to daily ETOH and likely need for augmented sedation.   His colonoscopy completed 09/29/2010.  Found multiple colonic polyp status post removal.  Surgical pathology found the polyps to be tubular adenoma.  Recommended 5 year repeat exam (2017). He is currently overdue.  Today he states he's doing well overall. Denies abdominal pain, N/V, hematochezia, melena, fever, chills, unintentional weight loss, acute changes in bowel habits. Denies chest pain, dyspnea, dizziness, lightheadedness, syncope, near syncope. Denies any other upper or lower GI symptoms.  Past Medical History:  Diagnosis Date  . GERD (gastroesophageal reflux disease)    states he had previous EGD  . Gout   . History of bronchitis   . HTN (hypertension)   . Hx of adenomatous colonic polyps   . Hyperlipidemia   . Hypertriglyceridemia   . Hypothyroidism   . Seasonal allergies   . Wears glasses     Past Surgical History:  Procedure Laterality Date  . BILATERAL CARPAL TUNNEL RELEASE Bilateral 02/12/2014   Procedure: BILATERAL CARPAL TUNNEL RELEASE;  Surgeon: Daryll Brod, MD;  Location: [facility];  Service: Orthopedics;  Laterality: Bilateral;  . CHOLECYSTECTOMY    . COLONOSCOPY  2006   adenomatous polyp, diverticulosis  . COLONOSCOPY  09/29/2010   Procedure: COLONOSCOPY;  Surgeon: Daneil Dolin, MD;  Location: [facility];  Service: Endoscopy;  Laterality: N/A;  . NECK SURGERY  2001   cerv fusion  . ROTATOR CUFF REPAIR  2006   righrt  . TONSILLECTOMY      Current Outpatient Medications  Medication Sig Dispense Refill  . allopurinol (ZYLOPRIM) 300 MG tablet Take 300  mg by mouth daily.      Marland Kitchen amLODipine (NORVASC) 10 MG tablet Take 10 mg by mouth daily.      Marland Kitchen aspirin 81 MG tablet Take 81 mg by mouth daily.    . diclofenac (VOLTAREN) 50 MG EC tablet Take 50 mg by mouth as needed.   3  . fish oil-omega-3 fatty acids 1000 MG capsule Take 2 g by mouth daily.      . fluticasone (FLONASE) 50 MCG/ACT nasal spray     . hydrochlorothiazide (HYDRODIURIL) 25 MG tablet Take 25 mg by mouth daily.    Marland Kitchen levothyroxine (SYNTHROID, LEVOTHROID) 112 MCG tablet Take 112 mcg by mouth daily.  4  . methocarbamol (ROBAXIN) 500 MG tablet Take 500 mg by mouth as needed.   4  . MICARDIS 80 MG tablet Take 80 mg by mouth daily.     . Na Sulfate-K Sulfate-Mg Sulf (SUPREP BOWEL PREP KIT) 17.5-3.13-1.6 GM/177ML SOLN Take 1 kit by mouth as directed. 1 Bottle 0  . pantoprazole (PROTONIX) 40 MG tablet Take 40 mg by mouth daily.  4  . simvastatin (ZOCOR) 20 MG tablet Take 20 mg by mouth at bedtime.      . TRICOR 145 MG tablet Take 145 mg by mouth daily.      No current facility-administered medications for this visit.     Allergies as of 08/14/2017  . (No Known Allergies)    Family History  Problem Relation Age of Onset  .  Colon cancer Maternal Grandfather 64       died age 78  . Prostate cancer Father 32       died age 47    Social History   Socioeconomic History  . Marital status: Married    Spouse name: Not on file  . Number of children: 2  . Years of education: Not on file  . Highest education level: Not on file  Occupational History    Employer: [employer]  Social Needs  . Financial resource strain: Not on file  . Food insecurity:    Worry: Not on file    Inability: Not on file  . Transportation needs:    Medical: Not on file    Non-medical: Not on file  Tobacco Use  . Smoking status: Current Some Day Smoker    Packs/day: 0.30    Types: Cigarettes  . Smokeless tobacco: Never Used  . Tobacco comment: intermittent smoker, some days with none.  Substance and  Sexual Activity  . Alcohol use: Yes    Comment: 2-3 drinks a day  . Drug use: No  . Sexual activity: Not on file  Lifestyle  . Physical activity:    Days per week: Not on file    Minutes per session: Not on file  . Stress: Not on file  Relationships  . Social connections:    Talks on phone: Not on file    Gets together: Not on file    Attends religious service: Not on file    Active member of club or organization: Not on file    Attends meetings of clubs or organizations: Not on file    Relationship status: Not on file  . Intimate partner violence:    Fear of current or ex partner: Not on file    Emotionally abused: Not on file    Physically abused: Not on file    Forced sexual activity: Not on file  Other Topics Concern  . Not on file  Social History Narrative  . Not on file    Review of Systems: General: Negative for anorexia, weight loss, fever, chills, fatigue, weakness. ENT: Negative for hoarseness, difficulty swallowing. CV: Negative for chest pain, angina, palpitations, peripheral edema.  Respiratory: Negative for dyspnea at rest, cough, sputum, wheezing.  GI: See history of present illness. MS: Negative for joint pain, low back pain.  Derm: Negative for rash or itching.  Endo: Negative for unusual weight change.  Heme: Negative for bruising or bleeding. Allergy: Negative for rash or hives.    Physical Exam: BP 134/79   Pulse 73   Temp (!) 97.4 F (36.3 C) (Oral)   Ht _0  (1.727 m)   Wt 208 lb (94.3 kg)   BMI 31.63 kg/m  General:   Alert and oriented. Pleasant and cooperative. Well-nourished and well-developed.  Head:  Normocephalic and atraumatic. Eyes:  Without icterus, sclera clear and conjunctiva pink.  Ears:  Normal auditory acuity. Cardiovascular:  S1, S2 present without murmurs appreciated. Extremities without clubbing or edema. Respiratory:  Clear to auscultation bilaterally. No wheezes, rales, or rhonchi. No distress.  Gastrointestinal:   +BS, soft, non-tender and non-distended. No HSM noted. No guarding or rebound. No masses appreciated.  Rectal:  Deferred  Musculoskalatal:  Symmetrical without gross deformities. Neurologic:  Alert and oriented x4;  grossly normal neurologically. Psych:  Alert and cooperative. Normal mood and affect. Heme/Lymph/Immune: No excessive bruising noted.    08/14/2017 2:21 PM   Disclaimer: This note was dictated  with voice recognition software. Similar sounding words can inadvertently be transcribed and may not be corrected upon review.

## 2017-08-14 NOTE — Telephone Encounter (Signed)
Pre-op scheduled for 10/15/17 at 9:00am. Patient aware. Letter mailed.

## 2017-08-14 NOTE — Telephone Encounter (Signed)
Called patient insurance and no PA is required for TCS

## 2017-08-15 ENCOUNTER — Telehealth: Payer: Self-pay | Admitting: *Deleted

## 2017-08-15 NOTE — Telephone Encounter (Signed)
Received a VM from patient stating he needs to r/s his TCS scheduled for 10/21/17.  Called patient and he is now scheduled for 10/24/17 at 10:15am. New prep instructions mailed to patient. Sherron Monday and LMOVM making aware of change

## 2017-08-16 NOTE — Telephone Encounter (Signed)
Per carolyn pre-op appt will stay the same

## 2017-08-30 ENCOUNTER — Ambulatory Visit (HOSPITAL_COMMUNITY): Admit: 2017-08-30 | Payer: No Typology Code available for payment source | Admitting: Internal Medicine

## 2017-08-30 ENCOUNTER — Encounter (HOSPITAL_COMMUNITY): Payer: Self-pay

## 2017-08-30 SURGERY — COLONOSCOPY
Anesthesia: Moderate Sedation

## 2017-09-06 MED FILL — DICLOFENAC SOD EC 50 MG TAB: 50 | 30 days supply | Qty: 90 | Fill #1

## 2017-09-06 MED FILL — METHOCARBAMOL 500 MG TABS: 500 | 30 days supply | Qty: 90 | Fill #2

## 2017-09-10 MED FILL — FLUTICASONE PROP 50 MCG SPR: 50 | 90 days supply | Qty: 48 | Fill #1

## 2017-09-10 MED FILL — LEVOTHYROXINE 112 MCG TAB: 112 | 90 days supply | Qty: 90 | Fill #1

## 2017-09-10 MED FILL — TELMISARTAN 80 MG TABS: 80 | 90 days supply | Qty: 90 | Fill #1

## 2017-09-10 MED FILL — AMLODIPINE BESYLATE 10 MG T: 10 | 90 days supply | Qty: 90 | Fill #1

## 2017-09-10 MED FILL — ALLOPURINOL 300 MG TABLET: 300 | 90 days supply | Qty: 90 | Fill #1

## 2017-10-02 MED FILL — SIMVASTATIN 20 MG TABLET: 20 | 90 days supply | Qty: 90 | Fill #1

## 2017-10-02 MED FILL — METHOCARBAMOL 500 MG TABS: 500 | 30 days supply | Qty: 90 | Fill #3

## 2017-10-02 MED FILL — DICLOFENAC SOD EC 50 MG TAB: 50 | 30 days supply | Qty: 90 | Fill #2

## 2017-10-02 MED FILL — HYDROCHLOROTHIAZIDE 25 MG T: 25 | 90 days supply | Qty: 90 | Fill #2

## 2017-10-02 MED FILL — PANTOPRAZOLE SOD DR 40 MG T: 40 | 90 days supply | Qty: 90 | Fill #1

## 2017-10-10 NOTE — Patient Instructions (Signed)
Louis Mccullough  10/10/2017     @PREFPERIOPPHARMACY @   Your procedure is scheduled on  10/24/2017 .  Report to [name] at  815   A.M.  Call this number if you have problems the morning of surgery:  [phone]   Remember:  Do not eat or drink after midnight.  You may drink clear liquids until (follow the instructions given to you) .  Clear liquids allowed are:                    Water, Juice (non-citric and without pulp), Carbonated beverages, Clear Tea, Black Coffee only, Plain Jell-O only, Gatorade and Plain Popsicles only    Take these medicines the morning of surgery with A SIP OF WATER  Allopurinol, amlodipine, voltaren(if needed) , levothyroxine, robaxin(if needed), micardis, protonix.    Do not wear jewelry, make-up or nail polish.  Do not wear lotions, powders, or perfumes, or deodorant.  Do not shave 48 hours prior to surgery.  Men may shave face and neck.  Do not bring valuables to the hospital.  [facility] is not responsible for any belongings or valuables.  Contacts, dentures or bridgework may not be worn into surgery.  Leave your suitcase in the car.  After surgery it may be brought to your room.  For patients admitted to the hospital, discharge time will be determined by your treatment team.  Patients discharged the day of surgery will not be allowed to drive home.   Name and phone number of your driver:   family Special instructions:  Follow the diet and prep instructions given to you by Dr [name] office.  Please read over the following fact sheets that you were given. Anesthesia Post-op Instructions and Care and Recovery After Surgery       Colonoscopy, Adult A colonoscopy is an exam to look at the large intestine. It is done to check for problems, such as:  Lumps (tumors).  Growths (polyps).  Swelling (inflammation).  Bleeding.  What happens before the procedure? Eating and drinking Follow instructions from your doctor about  eating and drinking. These instructions may include:  A few days before the procedure - follow a low-fiber diet. ? Avoid nuts. ? Avoid seeds. ? Avoid dried fruit. ? Avoid raw fruits. ? Avoid vegetables.  1-3 days before the procedure - follow a clear liquid diet. Avoid liquids that have red or purple dye. Drink only clear liquids, such as: ? Clear broth or bouillon. ? Black coffee or tea. ? Clear juice. ? Clear soft drinks or sports drinks. ? Gelatin dessert. ? Popsicles.  On the day of the procedure - do not eat or drink anything during the 2 hours before the procedure.  Bowel prep If you were prescribed an oral bowel prep:  Take it as told by your doctor. Starting the day before your procedure, you will need to drink a lot of liquid. The liquid will cause you to poop (have bowel movements) until your poop is almost clear or light green.  If your skin or butt gets irritated from diarrhea, you may: ? Wipe the area with wipes that have medicine in them, such as adult wet wipes with aloe and vitamin E. ? Put something on your skin that soothes the area, such as petroleum jelly.  If you throw up (vomit) while drinking the bowel prep, take a break for up to 60 minutes. Then begin the bowel  prep again. If you keep throwing up and you cannot take the bowel prep without throwing up, call your doctor.  General instructions  Ask your doctor about changing or stopping your normal medicines. This is important if you take diabetes medicines or blood thinners.  Plan to have someone take you home from the hospital or clinic. What happens during the procedure?  An IV tube may be put into one of your veins.  You will be given medicine to help you relax (sedative).  To reduce your risk of infection: ? Your doctors will wash their hands. ? Your anal area will be washed with soap.  You will be asked to lie on your side with your knees bent.  Your doctor will get a long, thin, flexible  tube ready. The tube will have a camera and a light on the end.  The tube will be put into your anus.  The tube will be gently put into your large intestine.  Air will be delivered into your large intestine to keep it open. You may feel some pressure or cramping.  The camera will be used to take photos.  A small tissue sample may be removed from your body to be looked at under a microscope (biopsy). If any possible problems are found, the tissue will be sent to a lab for testing.  If small growths are found, your doctor may remove them and have them checked for cancer.  The tube that was put into your anus will be slowly removed. The procedure may vary among doctors and hospitals. What happens after the procedure?  Your doctor will check on you often until the medicines you were given have worn off.  Do not drive for 24 hours after the procedure.  You may have a small amount of blood in your poop.  You may pass gas.  You may have mild cramps or bloating in your belly (abdomen).  It is up to you to get the results of your procedure. Ask your doctor, or the department performing the procedure, when your results will be ready. This information is not intended to replace advice given to you by your health care provider. Make sure you discuss any questions you have with your health care provider. Document Released: 03/31/2010 Document Revised: 12/28/2015 Document Reviewed: 05/10/2015 Elsevier Interactive Patient Education  2017 [employer]  Colonoscopy, Adult, Care After This sheet gives you information about how to care for yourself after your procedure. Your health care provider may also give you more specific instructions. If you have problems or questions, contact your health care provider. What can I expect after the procedure? After the procedure, it is common to have:  A small amount of blood in your stool for 24 hours after the procedure.  Some gas.  Mild abdominal  cramping or bloating.  Follow these instructions at home: General instructions   For the first 24 hours after the procedure: ? Do not drive or use machinery. ? Do not sign important documents. ? Do not drink alcohol. ? Do your regular daily activities at a slower pace than normal. ? Eat soft, easy-to-digest foods. ? Rest often.  Take over-the-counter or prescription medicines only as told by your health care provider.  It is up to you to get the results of your procedure. Ask your health care provider, or the department performing the procedure, when your results will be ready. Relieving cramping and bloating  Try walking around when you have cramps or feel bloated.  Apply heat to your abdomen as told by your health care provider. Use a heat source that your health care provider recommends, such as a moist heat pack or a heating pad. ? Place a towel between your skin and the heat source. ? Leave the heat on for 20-30 minutes. ? Remove the heat if your skin turns bright red. This is especially important if you are unable to feel pain, heat, or cold. You may have a greater risk of getting burned. Eating and drinking  Drink enough fluid to keep your urine clear or pale yellow.  Resume your normal diet as instructed by your health care provider. Avoid heavy or fried foods that are hard to digest.  Avoid drinking alcohol for as long as instructed by your health care provider. Contact a health care provider if:  You have blood in your stool 2-3 days after the procedure. Get help right away if:  You have more than a small spotting of blood in your stool.  You pass large blood clots in your stool.  Your abdomen is swollen.  You have nausea or vomiting.  You have a fever.  You have increasing abdominal pain that is not relieved with medicine. This information is not intended to replace advice given to you by your health care provider. Make sure you discuss any questions you  have with your health care provider. Document Released: 10/11/2003 Document Revised: 11/21/2015 Document Reviewed: 05/10/2015 Elsevier Interactive Patient Education  2018 [place] Anesthesia is a term that refers to techniques, procedures, and medicines that help a person stay safe and comfortable during a medical procedure. Monitored anesthesia care, or sedation, is one type of anesthesia. Your anesthesia specialist may recommend sedation if you will be having a procedure that does not require you to be unconscious, such as:  Cataract surgery.  A dental procedure.  A biopsy.  A colonoscopy.  During the procedure, you may receive a medicine to help you relax (sedative). There are three levels of sedation:  Mild sedation. At this level, you may feel awake and relaxed. You will be able to follow directions.  Moderate sedation. At this level, you will be sleepy. You may not remember the procedure.  Deep sedation. At this level, you will be asleep. You will not remember the procedure.  The more medicine you are given, the deeper your level of sedation will be. Depending on how you respond to the procedure, the anesthesia specialist may change your level of sedation or the type of anesthesia to fit your needs. An anesthesia specialist will monitor you closely during the procedure. Let your health care provider know about:  Any allergies you have.  All medicines you are taking, including vitamins, herbs, eye drops, creams, and over-the-counter medicines.  Any use of steroids (by mouth or as a cream).  Any problems you or family members have had with sedatives and anesthetic medicines.  Any blood disorders you have.  Any surgeries you have had.  Any medical conditions you have, such as sleep apnea.  Whether you are pregnant or may be pregnant.  Any use of cigarettes, alcohol, or street drugs. What are the risks? Generally, this is a safe  procedure. However, problems may occur, including:  Getting too much medicine (oversedation).  Nausea.  Allergic reaction to medicines.  Trouble breathing. If this happens, a breathing tube may be used to help with breathing. It will be removed when you are awake and breathing on your own.  Heart trouble.  Lung trouble.  Before the procedure Staying hydrated Follow instructions from your health care provider about hydration, which may include:  Up to 2 hours before the procedure - you may continue to drink clear liquids, such as water, clear fruit juice, black coffee, and plain tea.  Eating and drinking restrictions Follow instructions from your health care provider about eating and drinking, which may include:  8 hours before the procedure - stop eating heavy meals or foods such as meat, fried foods, or fatty foods.  6 hours before the procedure - stop eating light meals or foods, such as toast or cereal.  6 hours before the procedure - stop drinking milk or drinks that contain milk.  2 hours before the procedure - stop drinking clear liquids.  Medicines Ask your health care provider about:  Changing or stopping your regular medicines. This is especially important if you are taking diabetes medicines or blood thinners.  Taking medicines such as aspirin and ibuprofen. These medicines can thin your blood. Do not take these medicines before your procedure if your health care provider instructs you not to.  Tests and exams  You will have a physical exam.  You may have blood tests done to show: ? How well your kidneys and liver are working. ? How well your blood can clot.  General instructions  Plan to have someone take you home from the hospital or clinic.  If you will be going home right after the procedure, plan to have someone with you for 24 hours.  What happens during the procedure?  Your blood pressure, heart rate, breathing, level of pain and overall  condition will be monitored.  An IV tube will be inserted into one of your veins.  Your anesthesia specialist will give you medicines as needed to keep you comfortable during the procedure. This may mean changing the level of sedation.  The procedure will be performed. After the procedure  Your blood pressure, heart rate, breathing rate, and blood oxygen level will be monitored until the medicines you were given have worn off.  Do not drive for 24 hours if you received a sedative.  You may: ? Feel sleepy, clumsy, or nauseous. ? Feel forgetful about what happened after the procedure. ? Have a sore throat if you had a breathing tube during the procedure. ? Vomit. This information is not intended to replace advice given to you by your health care provider. Make sure you discuss any questions you have with your health care provider. Document Released: 11/22/2004 Document Revised: 08/05/2015 Document Reviewed: 06/19/2015 Elsevier Interactive Patient Education  2018 [place], Care After These instructions provide you with information about caring for yourself after your procedure. Your health care provider may also give you more specific instructions. Your treatment has been planned according to current medical practices, but problems sometimes occur. Call your health care provider if you have any problems or questions after your procedure. What can I expect after the procedure? After your procedure, it is common to:  Feel sleepy for several hours.  Feel clumsy and have poor balance for several hours.  Feel forgetful about what happened after the procedure.  Have poor judgment for several hours.  Feel nauseous or vomit.  Have a sore throat if you had a breathing tube during the procedure.  Follow these instructions at home: For at least 24 hours after the procedure:   Do not: ? Participate in activities in which you could fall  or become  injured. ? Drive. ? Use heavy machinery. ? Drink alcohol. ? Take sleeping pills or medicines that cause drowsiness. ? Make important decisions or sign legal documents. ? Take care of children on your own.  Rest. Eating and drinking  Follow the diet that is recommended by your health care provider.  If you vomit, drink water, juice, or soup when you can drink without vomiting.  Make sure you have little or no nausea before eating solid foods. General instructions  Have a responsible adult stay with you until you are awake and alert.  Take over-the-counter and prescription medicines only as told by your health care provider.  If you smoke, do not smoke without supervision.  Keep all follow-up visits as told by your health care provider. This is important. Contact a health care provider if:  You keep feeling nauseous or you keep vomiting.  You feel light-headed.  You develop a rash.  You have a fever. Get help right away if:  You have trouble breathing. This information is not intended to replace advice given to you by your health care provider. Make sure you discuss any questions you have with your health care provider. Document Released: 06/19/2015 Document Revised: 10/19/2015 Document Reviewed: 06/19/2015 Elsevier Interactive Patient Education  [employer].

## 2017-10-15 ENCOUNTER — Encounter (HOSPITAL_COMMUNITY)
Admission: RE | Admit: 2017-10-15 | Discharge: 2017-10-15 | Disposition: A | Discharge status: Home / Self Care | Payer: No Typology Code available for payment source | Source: Ambulatory Visit | Attending: Internal Medicine | Admitting: Internal Medicine

## 2017-10-15 ENCOUNTER — Encounter (HOSPITAL_COMMUNITY): Payer: Self-pay

## 2017-10-15 DIAGNOSIS — Z1211 Encounter for screening for malignant neoplasm of colon: Secondary | ICD-10-CM | POA: Insufficient documentation

## 2017-10-15 DIAGNOSIS — Z01810 Encounter for preprocedural cardiovascular examination: Secondary | ICD-10-CM | POA: Insufficient documentation

## 2017-10-15 LAB — BASIC METABOLIC PANEL
ANION GAP: 8 (ref 5–15)
BUN: 24 mg/dL — ABNORMAL HIGH (ref 8–23)
CO2: 24 mmol/L (ref 22–32)
Calcium: 9.2 mg/dL (ref 8.9–10.3)
Chloride: 103 mmol/L (ref 98–111)
Creatinine, Ser: 0.78 mg/dL (ref 0.61–1.24)
GFR calc Af Amer: >60 mL/min (ref >60)
Glucose, Bld: 111 mg/dL — ABNORMAL HIGH (ref 70–99)
Potassium: 3.6 mmol/L (ref 3.5–5.1)
SODIUM: 135 mmol/L (ref 135–145)

## 2017-10-15 LAB — CBC
HCT: 43 % (ref 39.0–52.0)
Hemoglobin: 15.5 g/dL (ref 13.0–17.0)
MCH: 35.5 pg — ABNORMAL HIGH (ref 26.0–34.0)
MCHC: 36 g/dL (ref 30.0–36.0)
MCV: 98.4 fL (ref 78.0–100.0)
Platelets: 207 10*3/uL (ref 150–400)
RBC: 4.37 MIL/uL (ref 4.22–5.81)
RDW: 12.5 % (ref 11.5–15.5)
WBC: 7.1 10*3/uL (ref 4.0–10.5)

## 2017-10-22 ENCOUNTER — Telehealth: Payer: Self-pay | Admitting: *Deleted

## 2017-10-22 NOTE — Telephone Encounter (Signed)
Patient called. He did not have his instructions for his procedure scheduled for Thursday. He asked me to fax them to him at [phone]. I have done so.

## 2017-10-24 ENCOUNTER — Encounter (HOSPITAL_COMMUNITY)
Admission: RE | Disposition: A | Discharge status: Home / Self Care | Payer: Self-pay | Source: Ambulatory Visit | Attending: Internal Medicine

## 2017-10-24 ENCOUNTER — Ambulatory Visit (HOSPITAL_COMMUNITY)
Admission: RE | Admit: 2017-10-24 | Discharge: 2017-10-24 | Disposition: A | Discharge status: Home / Self Care | Payer: No Typology Code available for payment source | Source: Ambulatory Visit | Attending: Internal Medicine | Admitting: Internal Medicine

## 2017-10-24 ENCOUNTER — Encounter (HOSPITAL_COMMUNITY): Payer: Self-pay

## 2017-10-24 ENCOUNTER — Ambulatory Visit (HOSPITAL_COMMUNITY): Payer: No Typology Code available for payment source | Admitting: Anesthesiology

## 2017-10-24 DIAGNOSIS — K5730 Diverticulosis of large intestine without perforation or abscess without bleeding: Secondary | ICD-10-CM | POA: Insufficient documentation

## 2017-10-24 DIAGNOSIS — E781 Pure hyperglyceridemia: Secondary | ICD-10-CM | POA: Diagnosis not present

## 2017-10-24 DIAGNOSIS — M109 Gout, unspecified: Secondary | ICD-10-CM | POA: Insufficient documentation

## 2017-10-24 DIAGNOSIS — D124 Benign neoplasm of descending colon: Secondary | ICD-10-CM | POA: Diagnosis not present

## 2017-10-24 DIAGNOSIS — Z800 Family history of malignant neoplasm of digestive organs: Secondary | ICD-10-CM | POA: Insufficient documentation

## 2017-10-24 DIAGNOSIS — K219 Gastro-esophageal reflux disease without esophagitis: Secondary | ICD-10-CM | POA: Diagnosis not present

## 2017-10-24 DIAGNOSIS — Z86010 Personal history of colonic polyps: Secondary | ICD-10-CM | POA: Insufficient documentation

## 2017-10-24 DIAGNOSIS — E039 Hypothyroidism, unspecified: Secondary | ICD-10-CM | POA: Insufficient documentation

## 2017-10-24 DIAGNOSIS — I10 Essential (primary) hypertension: Secondary | ICD-10-CM | POA: Diagnosis not present

## 2017-10-24 DIAGNOSIS — Z7951 Long term (current) use of inhaled steroids: Secondary | ICD-10-CM | POA: Insufficient documentation

## 2017-10-24 DIAGNOSIS — F17210 Nicotine dependence, cigarettes, uncomplicated: Secondary | ICD-10-CM | POA: Insufficient documentation

## 2017-10-24 DIAGNOSIS — Z1211 Encounter for screening for malignant neoplasm of colon: Secondary | ICD-10-CM | POA: Insufficient documentation

## 2017-10-24 DIAGNOSIS — D123 Benign neoplasm of transverse colon: Secondary | ICD-10-CM | POA: Insufficient documentation

## 2017-10-24 DIAGNOSIS — Z7982 Long term (current) use of aspirin: Secondary | ICD-10-CM | POA: Diagnosis not present

## 2017-10-24 HISTORY — PX: POLYPECTOMY: SHX5525

## 2017-10-24 HISTORY — PX: COLONOSCOPY WITH PROPOFOL: SHX5780

## 2017-10-24 SURGERY — COLONOSCOPY WITH PROPOFOL
Anesthesia: Monitor Anesthesia Care

## 2017-10-24 MED ORDER — CHLORHEXIDINE GLUCONATE CLOTH 2 % EX PADS: 6.0000 | MEDICATED_PAD | Freq: Once | CUTANEOUS | Status: DC

## 2017-10-24 MED ORDER — PROPOFOL 500 MG/50ML IV EMUL
INTRAVENOUS | Status: DC | PRN
  Administered 2017-10-24: 150 ug/kg/min via INTRAVENOUS
  Administered 2017-10-24: 125 ug/kg/min via INTRAVENOUS
  Administered 2017-10-24: 100 ug/kg/min via INTRAVENOUS

## 2017-10-24 MED ORDER — LACTATED RINGERS IV SOLN
INTRAVENOUS | Status: DC
  Administered 2017-10-24: 10:00:00 via INTRAVENOUS

## 2017-10-24 MED ORDER — PROPOFOL 10 MG/ML IV BOLUS
INTRAVENOUS | Status: DC | PRN
  Administered 2017-10-24 (×7): 20 mg via INTRAVENOUS

## 2017-10-24 NOTE — H&P (Signed)
$'@LOGO'Z$ @   Primary Care Physician:  Asencion Noble, MD Primary Gastroenterologist:  Dr. Gala Romney  Pre-Procedure History & Physical: HPI:  Louis Mccullough is a 62 y.o. male here for first mass colonoscopy history of multiple colonic adenomas removed 2012 no follow-up until now. No bowel symptoms.  Past Medical History:  Diagnosis Date  . GERD (gastroesophageal reflux disease)    states he had previous EGD  . Gout   . History of bronchitis   . HTN (hypertension)   . Hx of adenomatous colonic polyps   . Hyperlipidemia   . Hypertriglyceridemia   . Hypothyroidism   . Seasonal allergies   . Wears glasses     Past Surgical History:  Procedure Laterality Date  . BILATERAL CARPAL TUNNEL RELEASE Bilateral 02/12/2014   Procedure: BILATERAL CARPAL TUNNEL RELEASE;  Surgeon: Daryll Brod, MD;  Location: [facility];  Service: Orthopedics;  Laterality: Bilateral;  . CHOLECYSTECTOMY    . COLONOSCOPY  2006   adenomatous polyp, diverticulosis  . COLONOSCOPY  09/29/2010   Procedure: COLONOSCOPY;  Surgeon: Daneil Dolin, MD;  Location: [facility];  Service: Endoscopy;  Laterality: N/A;  . NECK SURGERY  2001   cerv fusion  . ROTATOR CUFF REPAIR  2006   righrt  . TONSILLECTOMY      Prior to Admission medications   Medication Sig Start Date End Date Taking? Authorizing Provider  allopurinol (ZYLOPRIM) 300 MG tablet Take 300 mg by mouth daily.     Yes [provider]  amLODipine (NORVASC) 10 MG tablet Take 10 mg by mouth daily.     Yes [provider]  aspirin 81 MG tablet Take 81 mg by mouth daily.   Yes [provider]  diclofenac (VOLTAREN) 50 MG EC tablet Take 50 mg by mouth daily as needed for moderate pain.  06/12/17  Yes [provider]  fish oil-omega-3 fatty acids 1000 MG capsule Take 1 g by mouth every other day.    Yes [provider]  fluticasone (FLONASE) 50 MCG/ACT nasal spray Place 2 sprays into both nostrils daily as needed for  allergies.  08/08/10  Yes [provider]  hydrochlorothiazide (HYDRODIURIL) 25 MG tablet Take 25 mg by mouth daily.   Yes [provider]  levothyroxine (SYNTHROID, LEVOTHROID) 112 MCG tablet Take 112 mcg by mouth daily. 06/11/17  Yes [provider]  methocarbamol (ROBAXIN) 500 MG tablet Take 500 mg by mouth daily as needed for muscle spasms.  06/11/17  Yes [provider]  MICARDIS 80 MG tablet Take 80 mg by mouth daily.  08/20/10  Yes [provider]  pantoprazole (PROTONIX) 40 MG tablet Take 40 mg by mouth daily. 06/12/17  Yes [provider]  simvastatin (ZOCOR) 20 MG tablet Take 20 mg by mouth at bedtime.     Yes [provider]  Na Sulfate-K Sulfate-Mg Sulf (SUPREP BOWEL PREP KIT) 17.5-3.13-1.6 GM/177ML SOLN Take 1 kit by mouth as directed. 06/24/17   Annitta Needs, NP    Allergies as of 08/14/2017  . (No Known Allergies)    Family History  Problem Relation Age of Onset  . Colon cancer Maternal Grandfather 35       died age 4  . Prostate cancer Father 26       died age 53    Social History   Socioeconomic History  . Marital status: Married    Spouse name: Not on file  . Number of children: 2  .  Years of education: Not on file  . Highest education level: Not on file  Occupational History    Employer: [employer]  Social Needs  . Financial resource strain: Not on file  . Food insecurity:    Worry: Not on file    Inability: Not on file  . Transportation needs:    Medical: Not on file    Non-medical: Not on file  Tobacco Use  . Smoking status: Current Some Day Smoker    Packs/day: 0.30    Types: Cigarettes  . Smokeless tobacco: Never Used  . Tobacco comment: intermittent smoker, some days with none.  Substance and Sexual Activity  . Alcohol use: Yes    Comment: 2-3 drinks a day  . Drug use: No  . Sexual activity: Not on file  Lifestyle  . Physical activity:    Days per week: Not on file    Minutes per  session: Not on file  . Stress: Not on file  Relationships  . Social connections:    Talks on phone: Not on file    Gets together: Not on file    Attends religious service: Not on file    Active member of club or organization: Not on file    Attends meetings of clubs or organizations: Not on file    Relationship status: Not on file  . Intimate partner violence:    Fear of current or ex partner: Not on file    Emotionally abused: Not on file    Physically abused: Not on file    Forced sexual activity: Not on file  Other Topics Concern  . Not on file  Social History Narrative  . Not on file    Review of Systems: See HPI, otherwise negative ROS  Physical Exam: BP (!) 142/84   Pulse 75   Temp 98.3 F (36.8 C) (Oral)   Resp 20   Ht '5\' 9"'$  (1.753 m)   Wt 90.3 kg   SpO2 96%   BMI 29.39 kg/m  General:   Alert,  Well-developed, well-nourished, pleasant and cooperative in NAD Neck:  Supple; no masses or thyromegaly. No significant cervical adenopathy. Lungs:  Clear throughout to auscultation.   No wheezes, crackles, or rhonchi. No acute distress. Heart:  Regular rate and rhythm; no murmurs, clicks, rubs,  or gallops. Abdomen: Non-distended, normal bowel sounds.  Soft and nontender without appreciable mass or hepatosplenomegaly.  Pulses:  Normal pulses noted. Extremities:  Without clubbing or edema.  Impression/Plan:  62 year old gentleman with a history of multiple adenomas.  Here for surveillance colonoscopy.  The risks, benefits, limitations, alternatives and imponderables have been reviewed with the patient. Questions have been answered. All parties are agreeable.      Notice: This dictation was prepared with Dragon dictation along with smaller phrase technology. Any transcriptional errors that result from this process are unintentional and may not be corrected upon review.

## 2017-10-24 NOTE — Addendum Note (Signed)
Addendum  created 10/24/17 1307 by Vista Deck, CRNA   Sign clinical note, SmartForm saved

## 2017-10-24 NOTE — Discharge Instructions (Signed)
°Colonoscopy °Discharge Instructions ° °Read the instructions outlined below and refer to this sheet in the next few weeks. These discharge instructions provide you with general information on caring for yourself after you leave the hospital. Your doctor may also give you specific instructions. While your treatment has been planned according to the most current medical practices available, unavoidable complications occasionally occur. If you have any problems or questions after discharge, call Dr. Rourk at [phone]. °ACTIVITY °· You may resume your regular activity, but move at a slower pace for the next 24 hours.  °· Take frequent rest periods for the next 24 hours.  °· Walking will help get rid of the air and reduce the bloated feeling in your belly (abdomen).  °· No driving for 24 hours (because of the medicine (anesthesia) used during the test).   °· Do not sign any important legal documents or operate any machinery for 24 hours (because of the anesthesia used during the test).  °NUTRITION °· Drink plenty of fluids.  °· You may resume your normal diet as instructed by your doctor.  °· Begin with a light meal and progress to your normal diet. Heavy or fried foods are harder to digest and may make you feel sick to your stomach (nauseated).  °· Avoid alcoholic beverages for 24 hours or as instructed.  °MEDICATIONS °· You may resume your normal medications unless your doctor tells you otherwise.  °WHAT YOU CAN EXPECT TODAY °· Some feelings of bloating in the abdomen.  °· Passage of more gas than usual.  °· Spotting of blood in your stool or on the toilet paper.  °IF YOU HAD POLYPS REMOVED DURING THE COLONOSCOPY: °· No aspirin products for 7 days or as instructed.  °· No alcohol for 7 days or as instructed.  °· Eat a soft diet for the next 24 hours.  °FINDING OUT THE RESULTS OF YOUR TEST °Not all test results are available during your visit. If your test results are not back during the visit, make an appointment  with your caregiver to find out the results. Do not assume everything is normal if you have not heard from your caregiver or the medical facility. It is important for you to follow up on all of your test results.  °SEEK IMMEDIATE MEDICAL ATTENTION IF: °· You have more than a spotting of blood in your stool.  °· Your belly is swollen (abdominal distention).  °· You are nauseated or vomiting.  °· You have a temperature over 101.  °· You have abdominal pain or discomfort that is severe or gets worse throughout the day.  ° ° °Diverticulosis and colon polyp information provided ° °Further recommendations to follow pending review of pathology report ° ° °Diverticulosis °Diverticulosis is a condition that develops when small pouches (diverticula) form in the wall of the large intestine (colon). The colon is where water is absorbed and stool is formed. The pouches form when the inside layer of the colon pushes through weak spots in the outer layers of the colon. You may have a few pouches or many of them. °What are the causes? °The cause of this condition is not known. °What increases the risk? °The following factors may make you more likely to develop this condition: °· Being older than age 60. Your risk for this condition increases with age. Diverticulosis is rare among people younger than age 30. By age 80, many people have it. °· Eating a low-fiber diet. °· Having frequent constipation. °· Being overweight. °·   Not getting enough exercise. °· Smoking. °· Taking over-the-counter pain medicines, like aspirin and ibuprofen. °· Having a family history of diverticulosis. ° °What are the signs or symptoms? °In most people, there are no symptoms of this condition. If you do have symptoms, they may include: °· Bloating. °· Cramps in the abdomen. °· Constipation or diarrhea. °· Pain in the lower left side of the abdomen. ° °How is this diagnosed? °This condition is most often diagnosed during an exam for other colon problems.  Because diverticulosis usually has no symptoms, it often cannot be diagnosed independently. This condition may be diagnosed by: °· Using a flexible scope to examine the colon (colonoscopy). °· Taking an X-ray of the colon after dye has been put into the colon (barium enema). °· Doing a CT scan. ° °How is this treated? °You may not need treatment for this condition if you have never developed an infection related to diverticulosis. If you have had an infection before, treatment may include: °· Eating a high-fiber diet. This may include eating more fruits, vegetables, and grains. °· Taking a fiber supplement. °· Taking a live bacteria supplement (probiotic). °· Taking medicine to relax your colon. °· Taking antibiotic medicines. ° °Follow these instructions at home: °· Drink 6-8 glasses of water or more each day to prevent constipation. °· Try not to strain when you have a bowel movement. °· If you have had an infection before: °? Eat more fiber as directed by your health care provider or your diet and nutrition specialist (dietitian). °? Take a fiber supplement or probiotic, if your health care provider approves. °· Take over-the-counter and prescription medicines only as told by your health care provider. °· If you were prescribed an antibiotic, take it as told by your health care provider. Do not stop taking the antibiotic even if you start to feel better. °· Keep all follow-up visits as told by your health care provider. This is important. °Contact a health care provider if: °· You have pain in your abdomen. °· You have bloating. °· You have cramps. °· You have not had a bowel movement in 3 days. °Get help right away if: °· Your pain gets worse. °· Your bloating becomes very bad. °· You have a fever or chills, and your symptoms suddenly get worse. °· You vomit. °· You have bowel movements that are bloody or black. °· You have bleeding from your rectum. °Summary °· Diverticulosis is a condition that develops when  small pouches (diverticula) form in the wall of the large intestine (colon). °· You may have a few pouches or many of them. °· This condition is most often diagnosed during an exam for other colon problems. °· If you have had an infection related to diverticulosis, treatment may include increasing the fiber in your diet, taking supplements, or taking medicines. °This information is not intended to replace advice given to you by your health care provider. Make sure you discuss any questions you have with your health care provider. °Document Released: 11/24/2003 Document Revised: 01/16/2016 Document Reviewed: 01/16/2016 °Elsevier Interactive Patient Education © 2017 [employer] ° °Colon Polyps °Polyps are tissue growths inside the body. Polyps can grow in many places, including the large intestine (colon). A polyp may be a round bump or a mushroom-shaped growth. You could have one polyp or several. °Most colon polyps are noncancerous (benign). However, some colon polyps can become cancerous over time. °What are the causes? °The exact cause of colon polyps is not known. °What   colon polyps is not known. What increases the risk? This condition is more likely to develop in people who:  Have a family history of colon cancer or colon polyps.  Are older than 92 or older than 45 if they are African American.  Have inflammatory bowel disease, such as ulcerative colitis or Crohn disease.  Are overweight.  Smoke cigarettes.  Do not get enough exercise.  Drink too much alcohol.  Eat a diet that is: ? High in fat and red meat. ? Low in fiber.  Had childhood cancer that was treated with abdominal radiation.  What are the signs or symptoms? Most polyps do not cause symptoms. If you have symptoms, they may include:  Blood coming from your rectum when having a bowel movement.  Blood in your stool.The stool may look dark red or black.  A change in bowel habits, such as constipation or diarrhea.  How is this diagnosed? This  condition is diagnosed with a colonoscopy. This is a procedure that uses a lighted, flexible scope to look at the inside of your colon. How is this treated? Treatment for this condition involves removing any polyps that are found. Those polyps will then be tested for cancer. If cancer is found, your health care provider will talk to you about options for colon cancer treatment. Follow these instructions at home: Diet  Eat plenty of fiber, such as fruits, vegetables, and whole grains.  Eat foods that are high in calcium and vitamin D, such as milk, cheese, yogurt, eggs, liver, fish, and broccoli.  Limit foods high in fat, red meats, and processed meats, such as hot dogs, sausage, bacon, and lunch meats.  Maintain a healthy weight, or lose weight if recommended by your health care provider. General instructions  Do not smoke cigarettes.  Do not drink alcohol excessively.  Keep all follow-up visits as told by your health care provider. This is important. This includes keeping regularly scheduled colonoscopies. Talk to your health care provider about when you need a colonoscopy.  Exercise every day or as told by your health care provider. Contact a health care provider if:  You have new or worsening bleeding during a bowel movement.  You have new or increased blood in your stool.  You have a change in bowel habits.  You unexpectedly lose weight. This information is not intended to replace advice given to you by your health care provider. Make sure you discuss any questions you have with your health care provider. Document Released: 11/23/2003 Document Revised: 08/04/2015 Document Reviewed: 01/17/2015 Elsevier Interactive Patient Education  2018 [place], Care After These instructions provide you with information about caring for yourself after your procedure. Your health care provider may also give you more specific instructions. Your treatment  has been planned according to current medical practices, but problems sometimes occur. Call your health care provider if you have any problems or questions after your procedure. What can I expect after the procedure? After your procedure, it is common to:  Feel sleepy for several hours.  Feel clumsy and have poor balance for several hours.  Feel forgetful about what happened after the procedure.  Have poor judgment for several hours.  Feel nauseous or vomit.  Have a sore throat if you had a breathing tube during the procedure.  Follow these instructions at home: For at least 24 hours after the procedure:   Do not: ? Participate in activities in which you could fall  or become injured. ? Drive. ? Use heavy machinery. ? Drink alcohol. ? Take sleeping pills or medicines that cause drowsiness. ? Make important decisions or sign legal documents. ? Take care of children on your own.  Rest. Eating and drinking  Follow the diet that is recommended by your health care provider.  If you vomit, drink water, juice, or soup when you can drink without vomiting.  Make sure you have little or no nausea before eating solid foods. General instructions  Have a responsible adult stay with you until you are awake and alert.  Take over-the-counter and prescription medicines only as told by your health care provider.  If you smoke, do not smoke without supervision.  Keep all follow-up visits as told by your health care provider. This is important. Contact a health care provider if:  You keep feeling nauseous or you keep vomiting.  You feel light-headed.  You develop a rash.  You have a fever. Get help right away if:  You have trouble breathing. This information is not intended to replace advice given to you by your health care provider. Make sure you discuss any questions you have with your health care provider. Document Released: 06/19/2015 Document Revised: 10/19/2015 Document  Reviewed: 06/19/2015 Elsevier Interactive Patient Education  [employer].

## 2017-10-24 NOTE — Anesthesia Postprocedure Evaluation (Addendum)
Anesthesia Post Note  Patient: Louis Mccullough  Procedure(s) Performed: COLONOSCOPY WITH PROPOFOL (N/A ) POLYPECTOMY  Patient location during evaluation: Short Stay Anesthesia Type: MAC Level of consciousness: awake and alert and patient cooperative Pain management: pain level controlled Vital Signs Assessment: post-procedure vital signs reviewed and stable Respiratory status: spontaneous breathing, nonlabored ventilation and respiratory function stable Cardiovascular status: stable and blood pressure returned to baseline Postop Assessment: no apparent nausea or vomiting Anesthetic complications: no     Last Vitals:  Vitals:   10/24/17 0941 10/24/17 1118  BP: (!) 142/84   Pulse: 75 82  Resp: 20 18  Temp: 36.8 C 36.6 C  SpO2: 96% 99%    Last Pain:  Vitals:   10/24/17 1118  TempSrc:   PainSc: 0-No pain                 Seraphine Gudiel

## 2017-10-24 NOTE — Op Note (Signed)
[facility] Patient Name: Louis Mccullough Procedure Date: 10/24/2017 10:23 AM MRN: [id number] Date of Birth: Apr 21, 1955 Attending MD: Norvel Richards , MD CSN: [id number] Age: 62 Admit Type: Outpatient Procedure:                Colonoscopy Indications:              High risk colon cancer surveillance: Personal                            history of colonic polyps Providers:                Norvel Richards, MD, Lurline Del, RN, Randa Spike, Technician Referring MD:              Medicines:                Propofol per Anesthesia Complications:            No immediate complications. Estimated Blood Loss:     Estimated blood loss was minimal. Procedure:                Pre-Anesthesia Assessment:                           - Prior to the procedure, a History and Physical                            was performed, and patient medications and                            allergies were reviewed. The patient's tolerance of                            previous anesthesia was also reviewed. The risks                            and benefits of the procedure and the sedation                            options and risks were discussed with the patient.                            All questions were answered, and informed consent                            was obtained. Prior Anticoagulants: The patient has                            taken no previous anticoagulant or antiplatelet                            agents. ASA Grade Assessment: II - A patient with  mild systemic disease. After reviewing the risks                            and benefits, the patient was deemed in                            satisfactory condition to undergo the procedure.                           After obtaining informed consent, the colonoscope                            was passed under direct vision. Throughout the                            procedure, the patient's  blood pressure, pulse, and                            oxygen saturations were monitored continuously. The                            [id number]-[id number] ([id number]) scope was introduced through                            the and advanced to the the cecum, identified by                            appendiceal orifice and ileocecal valve. The                            colonoscopy was performed without difficulty. The                            patient tolerated the procedure well. The quality                            of the bowel preparation was adequate. The entire                            colon was well visualized. Scope In: 10:50:16 AM Scope Out: 11:11:01 AM Scope Withdrawal Time: 0 hours 17 minutes 8 seconds  Total Procedure Duration: 0 hours 20 minutes 45 seconds  Findings:      The perianal and digital rectal examinations were normal.      Scattered small and large-mouthed diverticula were found in the entire       colon.      Two semi-pedunculated polyps were found in the splenic flexure and       hepatic flexure. The polyps were 4 to 6 mm in size. These polyps were       removed with a cold snare. Resection and retrieval were complete.       Estimated blood loss was minimal.      (1) 11 mm polyp was found in the descending colon. The polyp was       pedunculated. The polyp was removed with a  hot snare. Resection and       retrieval were complete. Estimated blood loss was minimal.      The exam was otherwise without abnormality on direct and retroflexion       views. Impression:               - Diverticulosis in the entire examined colon.                           - Two 4 to 6 mm polyps at the splenic flexure and                            at the hepatic flexure, removed with a cold snare.                            Resected and retrieved.                           - One 11 mm polyp in the descending colon, removed                            with a hot snare. Resected and retrieved.                            - The examination was otherwise normal on direct                            and retroflexion views. Moderate Sedation:      Moderate (conscious) sedation was personally administered by an       anesthesia professional. The following parameters were monitored: oxygen       saturation, heart rate, blood pressure, respiratory rate, EKG, adequacy       of pulmonary ventilation, and response to care. Total physician       intraservice time was 29 minutes. Recommendation:           - Patient has a contact number available for                            emergencies. The signs and symptoms of potential                            delayed complications were discussed with the                            patient. Return to normal activities tomorrow.                            Written discharge instructions were provided to the                            patient.                           - Resume previous diet.                           -  Continue present medications.                           - Repeat colonoscopy date to be determined after                            pending pathology results are reviewed for                            surveillance.                           - Return to GI office (date not yet determined). Procedure Code(s):        --- Professional ---                           [phone], Colonoscopy, flexible; with removal of                            tumor(s), polyp(s), or other lesion(s) by snare                            technique Diagnosis Code(s):        --- Professional ---                           Z86.010, Personal history of colonic polyps                           D12.3, Benign neoplasm of transverse colon (hepatic                            flexure or splenic flexure)                           D12.4, Benign neoplasm of descending colon                           K57.30, Diverticulosis of large intestine without                            perforation  or abscess without bleeding CPT copyright 2017 American Medical Association. All rights reserved. The codes documented in this report are preliminary and upon coder review may  be revised to meet current compliance requirements. [name]. Rourk, MD Norvel Richards, MD 10/24/2017 11:15:46 AM This report has been signed electronically. Number of Addenda: 0

## 2017-10-24 NOTE — Transfer of Care (Signed)
Immediate Anesthesia Transfer of Care Note  Patient: Louis Mccullough  Procedure(s) Performed: COLONOSCOPY WITH PROPOFOL (N/A ) POLYPECTOMY  Patient Location: PACU  Anesthesia Type:MAC  Level of Consciousness: awake and patient cooperative  Airway & Oxygen Therapy: Patient Spontanous Breathing  Post-op Assessment: Report given to RN and Post -op Vital signs reviewed and stable  Post vital signs: Reviewed and stable  Last Vitals:  Vitals Value Taken Time  BP    Temp    Pulse    Resp    SpO2      Last Pain:  Vitals:   10/24/17 1040  TempSrc:   PainSc: 1       Patients Stated Pain Goal: 5 (89/37/34 2876)  Complications: No apparent anesthesia complications

## 2017-10-24 NOTE — Anesthesia Preprocedure Evaluation (Addendum)
Anesthesia Evaluation  Patient identified by MRN, date of birth, ID band Patient awake    Reviewed: Allergy & Precautions, H&P , NPO status , Patient's Chart, lab work & pertinent test results, reviewed documented beta blocker date and time   Airway Mallampati: II  TM Distance: >3 FB Neck ROM: full    Dental no notable dental hx.    Pulmonary neg pulmonary ROS, Current Smoker,    Pulmonary exam normal breath sounds clear to auscultation       Cardiovascular Exercise Tolerance: Good hypertension, negative cardio ROS   Rhythm:regular Rate:Normal     Neuro/Psych PSYCHIATRIC DISORDERS negative neurological ROS  negative psych ROS   GI/Hepatic negative GI ROS, Neg liver ROS, GERD  ,  Endo/Other  negative endocrine ROSHypothyroidism   Renal/GU negative Renal ROS  negative genitourinary   Musculoskeletal   Abdominal   Peds  Hematology negative hematology ROS (+)   Anesthesia Other Findings   Reproductive/Obstetrics negative OB ROS                            Anesthesia Physical Anesthesia Plan  ASA: II  Anesthesia Plan: MAC   Post-op Pain Management:    Induction:   PONV Risk Score and Plan:   Airway Management Planned:   Additional Equipment:   Intra-op Plan:   Post-operative Plan:   Informed Consent: I have reviewed the patients History and Physical, chart, labs and discussed the procedure including the risks, benefits and alternatives for the proposed anesthesia with the patient or authorized representative who has indicated his/her understanding and acceptance.   Dental Advisory Given  Plan Discussed with: CRNA  Anesthesia Plan Comments:        Anesthesia Quick Evaluation

## 2017-10-26 ENCOUNTER — Encounter: Payer: Self-pay | Admitting: Internal Medicine

## 2017-10-29 ENCOUNTER — Encounter (HOSPITAL_COMMUNITY): Payer: Self-pay | Admitting: Internal Medicine

## 2017-11-27 MED FILL — SILDENAFIL CITRATE 20 MG TA: 20 | 10 days supply | Qty: 50 | Fill #0

## 2017-11-27 MED FILL — KETOCONAZOLE 2% CREAM: 2 | 30 days supply | Qty: 30 | Fill #0

## 2017-12-30 MED FILL — FLUTICASONE PROP 50 MCG SPR: 50 | 90 days supply | Qty: 48 | Fill #2

## 2017-12-30 MED FILL — ALLOPURINOL 300 MG TABS: 300 | 90 days supply | Qty: 90 | Fill #2

## 2017-12-30 MED FILL — SIMVASTATIN 20 MG TABLET: 20 | 90 days supply | Qty: 90 | Fill #2

## 2017-12-30 MED FILL — SHINGRIX 50 MCG SUS: 50 | 1 days supply | Qty: 1 | Fill #0

## 2017-12-30 MED FILL — LEVOTHYROXINE 112 MCG TAB: 112 | 90 days supply | Qty: 90 | Fill #2

## 2017-12-30 MED FILL — HYDROCHLOROTHIAZIDE 25 MG T: 25 | 90 days supply | Qty: 90 | Fill #3

## 2017-12-30 MED FILL — TELMISARTAN 80 MG TABS: 80 | 90 days supply | Qty: 90 | Fill #2

## 2017-12-30 MED FILL — AMLODIPINE BESYLATE 10 MG T: 10 | 90 days supply | Qty: 90 | Fill #2

## 2017-12-30 MED FILL — PANTOPRAZOLE SOD DR 40 MG T: 40 | 90 days supply | Qty: 90 | Fill #2

## 2018-03-06 MED FILL — SHINGRIX 50 MCG SUS: 50 | 1 days supply | Qty: 1 | Fill #1

## 2018-03-13 MED FILL — SHINGRIX 50 MCG SUS: 50 | 1 days supply | Qty: 1 | Fill #1

## 2018-03-20 MED FILL — PANTOPRAZOLE SOD DR 40 MG T: 40 | 90 days supply | Qty: 90 | Fill #3

## 2018-03-20 MED FILL — AMLODIPINE BESYLATE 10 MG T: 10 | 90 days supply | Qty: 90 | Fill #3

## 2018-03-20 MED FILL — SIMVASTATIN 20 MG TABLET: 20 | 90 days supply | Qty: 90 | Fill #3

## 2018-03-20 MED FILL — DICLOFENAC SOD EC 50 MG TAB: 50 | 30 days supply | Qty: 90 | Fill #3

## 2018-03-20 MED FILL — FLUTICASONE PROP 50 MCG SPR: 50 | 90 days supply | Qty: 48 | Fill #3

## 2018-03-20 MED FILL — TELMISARTAN 80 MG TABS: 80 | 90 days supply | Qty: 90 | Fill #3

## 2018-03-20 MED FILL — LEVOTHYROXINE 112 MCG TAB: 112 | 90 days supply | Qty: 90 | Fill #3

## 2018-03-20 MED FILL — METHOCARBAMOL 500 MG TABS: 500 | 30 days supply | Qty: 90 | Fill #4

## 2018-03-20 MED FILL — ALLOPURINOL 300 MG TABS: 300 | 90 days supply | Qty: 90 | Fill #3

## 2018-03-20 MED FILL — HYDROCHLOROTHIAZIDE 25 MG T: 25 | 90 days supply | Qty: 90 | Fill #4

## 2018-04-03 ENCOUNTER — Other Ambulatory Visit: Payer: Self-pay | Admitting: Otolaryngology

## 2018-04-03 DIAGNOSIS — H9312 Tinnitus, left ear: Secondary | ICD-10-CM

## 2018-04-03 DIAGNOSIS — H903 Sensorineural hearing loss, bilateral: Secondary | ICD-10-CM

## 2018-04-07 MED FILL — AMOX-CLAV 875-125 MG TABLET: 875-125 | 7 days supply | Qty: 14 | Fill #0

## 2018-04-14 ENCOUNTER — Ambulatory Visit
Admission: RE | Admit: 2018-04-14 | Discharge: 2018-04-14 | Disposition: A | Discharge status: Home / Self Care | Payer: No Typology Code available for payment source | Source: Ambulatory Visit | Attending: Otolaryngology | Admitting: Otolaryngology

## 2018-04-14 DIAGNOSIS — H9312 Tinnitus, left ear: Secondary | ICD-10-CM

## 2018-04-14 DIAGNOSIS — H903 Sensorineural hearing loss, bilateral: Secondary | ICD-10-CM

## 2018-04-14 MED ORDER — GADOBENATE DIMEGLUMINE 529 MG/ML IV SOLN
20.0000 mL | Freq: Once | INTRAVENOUS | Status: AC | PRN
  Administered 2018-04-14: 20 mL via INTRAVENOUS

## 2018-06-06 MED FILL — TELMISARTAN 80 MG TABS: 80 | 90 days supply | Qty: 90 | Fill #0

## 2018-06-06 MED FILL — ALLOPURINOL 300 MG TABS: 300 | 90 days supply | Qty: 90 | Fill #0

## 2018-06-06 MED FILL — HYDROCHLOROTHIAZIDE 25 MG T: 25 | 90 days supply | Qty: 90 | Fill #0

## 2018-06-06 MED FILL — PANTOPRAZOLE SOD DR 40 MG T: 40 | 90 days supply | Qty: 90 | Fill #0

## 2018-06-06 MED FILL — LEVOTHYROXINE 112 MCG TAB: 112 | 90 days supply | Qty: 90 | Fill #0

## 2018-06-06 MED FILL — SIMVASTATIN 20 MG TABLET: 20 | 90 days supply | Qty: 90 | Fill #0

## 2018-06-06 MED FILL — AMLODIPINE BESYLATE 10 MG T: 10 | 90 days supply | Qty: 90 | Fill #0

## 2018-09-02 ENCOUNTER — Other Ambulatory Visit: Payer: Self-pay | Admitting: Internal Medicine

## 2018-09-02 DIAGNOSIS — Z122 Encounter for screening for malignant neoplasm of respiratory organs: Secondary | ICD-10-CM

## 2018-09-08 MED FILL — SIMVASTATIN 20 MG TABLET: 20 | 90 days supply | Qty: 90 | Fill #0

## 2018-09-08 MED FILL — METHOCARBAMOL 500 MG TABS: 500 | 30 days supply | Qty: 90 | Fill #0

## 2018-09-08 MED FILL — AMLODIPINE BESYLATE 10 MG T: 10 | 90 days supply | Qty: 90 | Fill #0

## 2018-09-08 MED FILL — ALLOPURINOL 300 MG TABS: 300 | 90 days supply | Qty: 90 | Fill #0

## 2018-09-08 MED FILL — TELMISARTAN 80 MG TABS: 80 | 90 days supply | Qty: 90 | Fill #0

## 2018-09-08 MED FILL — PANTOPRAZOLE SOD DR 40 MG T: 40 | 90 days supply | Qty: 90 | Fill #0

## 2018-09-08 MED FILL — LEVOTHYROXINE 112 MCG TAB: 112 | 90 days supply | Qty: 90 | Fill #0

## 2018-09-08 MED FILL — HYDROCHLOROTHIAZIDE 25 MG T: 25 | 90 days supply | Qty: 90 | Fill #0

## 2018-09-08 MED FILL — FLUTICASONE PROP 50 MCG SPR: 50 | 90 days supply | Qty: 48 | Fill #0

## 2018-09-11 MED FILL — KETOCONAZOLE 2% CREAM: 2 | 30 days supply | Qty: 30 | Fill #1

## 2018-09-11 MED FILL — DICLOFENAC SOD EC 50 MG TAB: 50 | 30 days supply | Qty: 90 | Fill #0

## 2018-09-11 MED FILL — SILDENAFIL CITRATE 20 MG TA: 20 | 10 days supply | Qty: 50 | Fill #1

## 2018-09-24 ENCOUNTER — Ambulatory Visit (HOSPITAL_COMMUNITY): Admission: RE | Admit: 2018-09-24 | Payer: No Typology Code available for payment source | Source: Ambulatory Visit

## 2018-09-24 ENCOUNTER — Encounter (HOSPITAL_COMMUNITY): Payer: Self-pay

## 2018-11-28 ENCOUNTER — Ambulatory Visit: Payer: No Typology Code available for payment source | Admitting: Orthopaedic Surgery

## 2018-11-28 ENCOUNTER — Other Ambulatory Visit: Payer: Self-pay

## 2018-11-28 ENCOUNTER — Encounter: Payer: Self-pay | Admitting: Orthopaedic Surgery

## 2018-11-28 ENCOUNTER — Ambulatory Visit (INDEPENDENT_AMBULATORY_CARE_PROVIDER_SITE_OTHER): Payer: No Typology Code available for payment source

## 2018-11-28 VITALS — BP 143/83 | HR 86 | Ht 68.5 in | Wt 202.0 lb

## 2018-11-28 DIAGNOSIS — G8929 Other chronic pain: Secondary | ICD-10-CM | POA: Diagnosis not present

## 2018-11-28 DIAGNOSIS — M25512 Pain in left shoulder: Secondary | ICD-10-CM | POA: Insufficient documentation

## 2018-11-28 MED FILL — TELMISARTAN 80 MG TABS: 80 | 90 days supply | Qty: 90 | Fill #1

## 2018-11-28 MED FILL — DICLOFENAC SOD EC 50 MG TAB: 50 | 30 days supply | Qty: 90 | Fill #1

## 2018-11-28 MED FILL — FLUTICASONE PROP 50 MCG SPR: 50 | 90 days supply | Qty: 48 | Fill #1

## 2018-11-28 MED FILL — HYDROCHLOROTHIAZIDE 25 MG T: 25 | 90 days supply | Qty: 90 | Fill #1

## 2018-11-28 MED FILL — AMLODIPINE BESYLATE 10 MG T: 10 | 90 days supply | Qty: 90 | Fill #1

## 2018-11-28 MED FILL — ALLOPURINOL 300 MG TABS: 300 | 90 days supply | Qty: 90 | Fill #1

## 2018-11-28 MED FILL — PANTOPRAZOLE SOD DR 40 MG T: 40 | 90 days supply | Qty: 90 | Fill #1

## 2018-11-28 MED FILL — LEVOTHYROXINE 112 MCG TAB: 112 | 90 days supply | Qty: 90 | Fill #1

## 2018-11-28 MED FILL — SIMVASTATIN 20 MG TABLET: 20 | 90 days supply | Qty: 90 | Fill #1

## 2018-11-28 NOTE — Progress Notes (Signed)
Office Visit Note   Patient: Louis Mccullough           Date of Birth: 1955-12-04           MRN: [id number] Visit Date: 11/28/2018              Requested by: Asencion Noble, MD [address] [place],  [place] [address] PCP: Asencion Noble, MD   Assessment & Plan: Visit Diagnoses:  1. Chronic left shoulder pain     Plan: Probable rotator cuff tear left shoulder.  Pain has been chronic and recurrent.  I think at this point it is best to obtain an MRI scan as he has had prior exercises, over-the-counter medicines and injection  Follow-Up Instructions: Return After MRI scan left shoulder.   Orders:  Orders Placed This Encounter  Procedures  . XR Shoulder Left  . MR SHOULDER LEFT WO CONTRAST   No orders of the defined types were placed in this encounter.     Procedures: No procedures performed   Clinical Data: No additional findings.   Subjective: Chief Complaint  Patient presents with  . Left Shoulder - Pain  Patient presents today for recurrent left shoulder pain. No known injury. his pain is located superiorly. His range of motion is limited when painful. Patient states that the pain wakes him at night.  He takes methocarbamol and Diclofenac as needed. He saw Dr.Whitfield in the past for this shoulder.  Prior cortisone injection with good relief several years ago.  Has reached a point where her shoulder is a compromise of his activities and sleep.  No related neck pain or numbness or tingling  HPI  Review of Systems   Objective: Vital Signs: BP (!) 143/83   Pulse 86   Ht 5' 8.5" (1.74 m)   Wt 202 lb (91.6 kg)   BMI 30.27 kg/m   Physical Exam Constitutional:      Appearance: He is well-developed.  Eyes:     Pupils: Pupils are equal, round, and reactive to light.  Pulmonary:     Effort: Pulmonary effort is normal.  Skin:    General: Skin is warm and dry.  Neurological:     Mental Status: He is alert and oriented to person, place, and time.  Psychiatric:         Behavior: Behavior normal.     Ortho Exam awake alert and oriented x3.  Comfortable sitting.  Left shoulder with full overhead motion with some pain.  Positive impingement.  Positive empty can testing.  No localized areas of tenderness.  Biceps intact.  Good grip and good release.  No grating or crepitation  Specialty Comments:  No specialty comments available.  Imaging: Xr Shoulder Left  Result Date: 11/28/2018 Films of the left shoulder were obtained in several projections.  The humeral head is centered about the glenoid.  Possibly slight narrowing of the space between the humeral head and the acromium.  Some degenerative changes of the [facility] joint.  No acute changes.  No ectopic calcification.  No obvious arthritis of the glenohumeral joint    PMFS History: Patient Active Problem List   Diagnosis Date Noted  . Pain in left shoulder 11/28/2018  . Encounter for screening colonoscopy 08/14/2017  . Alcohol abuse, daily use 08/14/2017  . GERD (gastroesophageal reflux disease) 08/29/2010  . Hx of adenomatous colonic polyps 08/29/2010  . FHx: colon cancer 08/29/2010   Past Medical History:  Diagnosis Date  . GERD (gastroesophageal reflux disease)  states he had previous EGD  . Gout   . History of bronchitis   . HTN (hypertension)   . Hx of adenomatous colonic polyps   . Hyperlipidemia   . Hypertriglyceridemia   . Hypothyroidism   . Seasonal allergies   . Wears glasses     Family History  Problem Relation Age of Onset  . Colon cancer Maternal Grandfather 18       died age 7  . Prostate cancer Father 72       died age 1    Past Surgical History:  Procedure Laterality Date  . BILATERAL CARPAL TUNNEL RELEASE Bilateral 02/12/2014   Procedure: BILATERAL CARPAL TUNNEL RELEASE;  Surgeon: Daryll Brod, MD;  Location: [place];  Service: Orthopedics;  Laterality: Bilateral;  . CHOLECYSTECTOMY    . COLONOSCOPY  2006   adenomatous polyp, diverticulosis  .  COLONOSCOPY  09/29/2010   Procedure: COLONOSCOPY;  Surgeon: Daneil Dolin, MD;  [facility];  Service: Endoscopy;  Laterality: N/A;  . COLONOSCOPY WITH PROPOFOL N/A 10/24/2017   Procedure: COLONOSCOPY WITH PROPOFOL;  Surgeon: Daneil Dolin, MD;  [facility];  Service: Endoscopy;  Laterality: N/A;  11:00am  . NECK SURGERY  2001   cerv fusion  . POLYPECTOMY  10/24/2017   Procedure: POLYPECTOMY;  Surgeon: Daneil Dolin, MD;  [facility];  Service: Endoscopy;;  splenic flexure and hepatic flexure, descending colon  . ROTATOR CUFF REPAIR  2006   righrt  . TONSILLECTOMY     Social History   Occupational History    Employer: [employer]  Tobacco Use  . Smoking status: Current Some Day Smoker    Packs/day: 0.30    Types: Cigarettes  . Smokeless tobacco: Never Used  . Tobacco comment: intermittent smoker, some days with none.  Substance and Sexual Activity  . Alcohol use: Yes    Comment: 2-3 drinks a day  . Drug use: No  . Sexual activity: Not on file

## 2018-12-03 ENCOUNTER — Telehealth: Payer: Self-pay | Admitting: Orthopaedic Surgery

## 2018-12-03 NOTE — Telephone Encounter (Signed)
Tried to call patient. No answer and no voice mail 

## 2018-12-03 NOTE — Telephone Encounter (Signed)
Please see below.

## 2018-12-03 NOTE — Telephone Encounter (Signed)
Patient called checking on the status of his MRI referral to [facility] in [place].

## 2018-12-08 NOTE — Telephone Encounter (Signed)
I checked status of referral order for [facility] and looks like his order was sent to [facility] imaging, I changed order and contacted April P with scheduling, they will contact pt tos chedule appt

## 2018-12-16 ENCOUNTER — Ambulatory Visit (HOSPITAL_COMMUNITY)
Admission: RE | Admit: 2018-12-16 | Discharge: 2018-12-16 | Disposition: A | Discharge status: Home / Self Care | Payer: No Typology Code available for payment source | Source: Ambulatory Visit | Attending: Orthopaedic Surgery | Admitting: Orthopaedic Surgery

## 2018-12-16 ENCOUNTER — Other Ambulatory Visit: Payer: Self-pay

## 2018-12-16 DIAGNOSIS — M25512 Pain in left shoulder: Secondary | ICD-10-CM | POA: Insufficient documentation

## 2018-12-16 DIAGNOSIS — G8929 Other chronic pain: Secondary | ICD-10-CM | POA: Diagnosis present

## 2018-12-19 MED FILL — FLUARIX QUADRIVALENT 0.5 ML: 0.5 | 1 days supply | Qty: 1 | Fill #0

## 2018-12-23 ENCOUNTER — Other Ambulatory Visit: Payer: Self-pay

## 2018-12-23 ENCOUNTER — Ambulatory Visit (INDEPENDENT_AMBULATORY_CARE_PROVIDER_SITE_OTHER): Payer: No Typology Code available for payment source | Admitting: Orthopaedic Surgery

## 2018-12-23 ENCOUNTER — Encounter: Payer: Self-pay | Admitting: Orthopaedic Surgery

## 2018-12-23 VITALS — BP 147/88 | HR 84 | Ht 69.0 in | Wt 203.0 lb

## 2018-12-23 DIAGNOSIS — G8929 Other chronic pain: Secondary | ICD-10-CM | POA: Diagnosis not present

## 2018-12-23 DIAGNOSIS — M25512 Pain in left shoulder: Secondary | ICD-10-CM

## 2018-12-23 NOTE — Progress Notes (Signed)
Office Visit Note   Patient: Louis Mccullough           Date of Birth: 24-Mar-1955           MRN: [id number] Visit Date: 12/23/2018              Requested by: Asencion Noble, MD [address] [place],  [place] [address] PCP: Asencion Noble, MD   Assessment & Plan: Visit Diagnoses:  1. Chronic left shoulder pain     Plan: MRI scan demonstrates complete supra and infraspinatus tendon tears with retraction of about 1.5 to 2.5 cm.  Subscapularis and teres minor are intact.  Moderate to moderately severe AC joint arthritis.  No atrophy or focal lesions in the muscles the long head of biceps intact.  No degenerative changes of the chromic clavicular joint.  Long discussion with Mr. Caravalho regarding the MRI scan findings.  I would suggest an arthroscopic SCD, DCR and mini open rotator cuff tear repair with possible derma span patch.  He would like to proceed.  He is familiar with the procedure having had [place] similar surgery on the right  Follow-Up Instructions: Return We will schedule rotator cuff tear repair.   Orders:  No orders of the defined types were placed in this encounter.  No orders of the defined types were placed in this encounter.     Procedures: No procedures performed   Clinical Data: No additional findings.   Subjective: Chief Complaint  Patient presents with  . Left Shoulder - Follow-up    MRI results  Patient presents today for a one month follow up on his left shoulder. He had an MRI on 12/16/2018 and is here today for those results. Patient states that his shoulder is the same as his last visit. He takes Diclofenac and Robaxin twice daily.  HPI  Review of Systems   Objective: Vital Signs: BP (!) 147/88   Pulse 84   Ht 5\' 9"  (1.753 m)   Wt 203 lb (92.1 kg)   BMI 29.98 kg/m   Physical Exam Constitutional:      Appearance: He is well-developed.  Eyes:     Pupils: Pupils are equal, round, and reactive to light.  Pulmonary:     Effort: Pulmonary effort is  normal.  Skin:    General: Skin is warm and dry.  Neurological:     Mental Status: He is alert and oriented to person, place, and time.  Psychiatric:        Behavior: Behavior normal.     Ortho Exam awake alert and oriented x3.  Comfortable sitting.  Able to quickly place his left arm over his head with some discomfort.  Positive impingement on extremes of internal and external rotation.  Biceps intact.  Appears to have a little weakness with external rotation good grip and good release skin intact  Specialty Comments:  No specialty comments available.  Imaging: No results found.   PMFS History: Patient Active Problem List   Diagnosis Date Noted  . Pain in left shoulder 11/28/2018  . Encounter for screening colonoscopy 08/14/2017  . Alcohol abuse, daily use 08/14/2017  . GERD (gastroesophageal reflux disease) 08/29/2010  . Hx of adenomatous colonic polyps 08/29/2010  . FHx: colon cancer 08/29/2010   Past Medical History:  Diagnosis Date  . GERD (gastroesophageal reflux disease)    states he had previous EGD  . Gout   . History of bronchitis   . HTN (hypertension)   . Hx of  adenomatous colonic polyps   . Hyperlipidemia   . Hypertriglyceridemia   . Hypothyroidism   . Seasonal allergies   . Wears glasses     Family History  Problem Relation Age of Onset  . Colon cancer Maternal Grandfather 23       died age 59  . Prostate cancer Father 51       died age 88    Past Surgical History:  Procedure Laterality Date  . BILATERAL CARPAL TUNNEL RELEASE Bilateral 02/12/2014   Procedure: BILATERAL CARPAL TUNNEL RELEASE;  Surgeon: Daryll Brod, MD;  Location: [place];  Service: Orthopedics;  Laterality: Bilateral;  . CHOLECYSTECTOMY    . COLONOSCOPY  2006   adenomatous polyp, diverticulosis  . COLONOSCOPY  09/29/2010   Procedure: COLONOSCOPY;  Surgeon: Daneil Dolin, MD;  [facility];  Service: Endoscopy;  Laterality: N/A;  . COLONOSCOPY WITH  PROPOFOL N/A 10/24/2017   Procedure: COLONOSCOPY WITH PROPOFOL;  Surgeon: Daneil Dolin, MD;  [facility];  Service: Endoscopy;  Laterality: N/A;  11:00am  . NECK SURGERY  2001   cerv fusion  . POLYPECTOMY  10/24/2017   Procedure: POLYPECTOMY;  Surgeon: Daneil Dolin, MD;  [facility];  Service: Endoscopy;;  splenic flexure and hepatic flexure, descending colon  . ROTATOR CUFF REPAIR  2006   righrt  . TONSILLECTOMY     Social History   Occupational History    Employer: [employer]  Tobacco Use  . Smoking status: Current Some Day Smoker    Packs/day: 0.30    Types: Cigarettes  . Smokeless tobacco: Never Used  . Tobacco comment: intermittent smoker, some days with none.  Substance and Sexual Activity  . Alcohol use: Yes    Comment: 2-3 drinks a day  . Drug use: No  . Sexual activity: Not on file

## 2019-01-07 NOTE — H&P (Addendum)
Louis Mccullough is an 63 y.o. male.   Chief Complaint: Painful Left Shoulder  HPI: Louis Mccullough was initially seen 11/28/2018 for recurrent left shoulder pain. No known injury. Pain is located superiorly. His range of motion is limited when painful. Patient states that the pain wakes him at night.  He takes methocarbamol and Diclofenac as needed. He was seen in the past for this shoulder.  Prior cortisone injection with good relief several years ago.  Has reached a point where his shoulder has compromised his activities and sleep.  No related neck pain or numbness or tingling. MRI was ordered.  Louis Shoulder Left Wo Contrast  Result Date: 12/17/2018 CLINICAL DATA:  Chronic left shoulder pain.  No known injury. EXAM: MRI OF THE LEFT SHOULDER WITHOUT CONTRAST TECHNIQUE: Multiplanar, multisequence Louis imaging of the shoulder was performed. No intravenous contrast was administered. COMPARISON:  None. FINDINGS: Rotator cuff: The patient has marked rotator cuff tendinopathy. There are complete supraspinatus and infraspinatus tendon tears with retraction to the top of the humeral head, 1.5-2.5 cm. The subscapularis and teres minor are intact. Muscles:  No atrophy or focal lesion. Biceps long head:  Intact. Acromioclavicular Joint: Moderate to moderately severe osteoarthritis. Type 2 acromion. Fluid is seen in the subacromial/subdeltoid bursa. Glenohumeral Joint: Negative. Labrum:  Intact. Bones:  No fracture or focal lesion. Other: None. IMPRESSION: Complete supraspinatus and infraspinatus tendon tears with 1.5-2.5 cm of retraction. No atrophy. Moderate to moderately severe acromioclavicular osteoarthritis. Subacromial/subdeltoid bursitis. Electronically Signed   By: Inge Rise M.D.   On: 12/17/2018 10:15   With review and discussion with the patient at his 12/23/2018 follow up visit, it was felt surgery was indicated.   Plan for arthroscopic SCD, DCR and mini open rotator cuff tear repair with possible dermaspan  patch   Past Medical History:  Diagnosis Date  . GERD (gastroesophageal reflux disease)    states he had previous EGD  . Gout   . History of bronchitis   . HTN (hypertension)   . Hx of adenomatous colonic polyps   . Hyperlipidemia   . Hypertriglyceridemia   . Hypothyroidism   . Seasonal allergies   . Wears glasses     Past Surgical History:  Procedure Laterality Date  . BILATERAL CARPAL TUNNEL RELEASE Bilateral 02/12/2014   Procedure: BILATERAL CARPAL TUNNEL RELEASE;  Surgeon: Daryll Brod, MD;  Location: [place];  Service: Orthopedics;  Laterality: Bilateral;  . CHOLECYSTECTOMY    . COLONOSCOPY  2006   adenomatous polyp, diverticulosis  . COLONOSCOPY  09/29/2010   Procedure: COLONOSCOPY;  Surgeon: Daneil Dolin, MD;  [facility];  Service: Endoscopy;  Laterality: N/A;  . COLONOSCOPY WITH PROPOFOL N/A 10/24/2017   Procedure: COLONOSCOPY WITH PROPOFOL;  Surgeon: Daneil Dolin, MD;  [facility];  Service: Endoscopy;  Laterality: N/A;  11:00am  . NECK SURGERY  2001   cerv fusion  . POLYPECTOMY  10/24/2017   Procedure: POLYPECTOMY;  Surgeon: Daneil Dolin, MD;  [facility];  Service: Endoscopy;;  splenic flexure and hepatic flexure, descending colon  . ROTATOR CUFF REPAIR  2006   righrt  . TONSILLECTOMY      Family History  Problem Relation Age of Onset  . Colon cancer Maternal Grandfather 69       died age 46  . Prostate cancer Father 45       died age 46   Social History:  reports that he has been smoking cigarettes.  He has been smoking about 0.30 packs per day. He has never used smokeless tobacco. He reports current alcohol use. He reports that he does not use drugs.  Allergies: No Known Allergies  No current facility-administered medications for this encounter.    Current Outpatient Medications  Medication Sig Dispense Refill  . allopurinol (ZYLOPRIM) 300 MG tablet Take 300 mg by mouth daily.      Marland Kitchen amLODipine  (NORVASC) 10 MG tablet Take 10 mg by mouth daily.      Marland Kitchen aspirin 81 MG tablet Take 81 mg by mouth daily.    . diclofenac (VOLTAREN) 50 MG EC tablet Take 50 mg by mouth daily as needed for moderate pain.   3  . fish oil-omega-3 fatty acids 1000 MG capsule Take 1 g by mouth every other day.     . fluticasone (FLONASE) 50 MCG/ACT nasal spray Place 2 sprays into both nostrils daily as needed for allergies.     . hydrochlorothiazide (HYDRODIURIL) 25 MG tablet Take 25 mg by mouth daily.    Marland Kitchen levothyroxine (SYNTHROID, LEVOTHROID) 112 MCG tablet Take 112 mcg by mouth daily.  4  . methocarbamol (ROBAXIN) 500 MG tablet Take 500 mg by mouth daily as needed for muscle spasms.   4  . MICARDIS 80 MG tablet Take 80 mg by mouth daily.     . Na Sulfate-K Sulfate-Mg Sulf (SUPREP BOWEL PREP KIT) 17.5-3.13-1.6 GM/177ML SOLN Take 1 kit by mouth as directed. 1 Bottle 0  . pantoprazole (PROTONIX) 40 MG tablet Take 40 mg by mouth daily.  4  . simvastatin (ZOCOR) 20 MG tablet Take 20 mg by mouth at bedtime.           Review of Systems  All other systems reviewed and are negative.   There were no vitals taken for this visit.   Physical Exam  Constitutional: He is oriented to person, place, and time. He appears well-developed and well-nourished.  HENT:  Head: Normocephalic.  Eyes: Pupils are equal, round, and reactive to light. Conjunctivae and EOM are normal.  Cardiovascular: Normal rate and regular rhythm.  Respiratory: Effort normal and breath sounds normal.  GI: Soft.  Neurological: He is alert and oriented to person, place, and time.  Skin: Skin is warm and dry.  Psychiatric: He has a normal mood and affect. His behavior is normal. Judgment and thought content normal.  Ortho: Left shoulder with full overhead motion with some pain.  Positive impingement.  Positive empty can testing.  No localized areas of tenderness.  Biceps intact.  Good grip and good release.  No grating or  crepitation     Assessment/Plan Complete supraspinatus and infraspinatus tendon tears with 1.5-2.5 cm of retraction. No atrophy. Moderate to moderately severe acromioclavicular osteoarthritis. Subacromial/subdeltoid bursitis.   Plan: Arthroscopic left SAD, DCR and mini open rotator cuff tear repair with possible derma span patch  Biagio Borg, PA-C 01/12/2019, 12:33 PM

## 2019-01-14 ENCOUNTER — Encounter (HOSPITAL_BASED_OUTPATIENT_CLINIC_OR_DEPARTMENT_OTHER): Payer: Self-pay | Admitting: *Deleted

## 2019-01-14 ENCOUNTER — Other Ambulatory Visit: Payer: Self-pay

## 2019-01-16 ENCOUNTER — Other Ambulatory Visit: Payer: Self-pay

## 2019-01-16 ENCOUNTER — Other Ambulatory Visit (HOSPITAL_COMMUNITY)
Admission: RE | Admit: 2019-01-16 | Discharge: 2019-01-16 | Disposition: A | Discharge status: Home / Self Care | Payer: No Typology Code available for payment source | Source: Ambulatory Visit | Attending: Orthopaedic Surgery | Admitting: Orthopaedic Surgery

## 2019-01-16 ENCOUNTER — Encounter (HOSPITAL_BASED_OUTPATIENT_CLINIC_OR_DEPARTMENT_OTHER)
Admission: RE | Admit: 2019-01-16 | Discharge: 2019-01-16 | Disposition: A | Discharge status: Home / Self Care | Payer: No Typology Code available for payment source | Source: Ambulatory Visit | Attending: Orthopaedic Surgery | Admitting: Orthopaedic Surgery

## 2019-01-16 DIAGNOSIS — F17210 Nicotine dependence, cigarettes, uncomplicated: Secondary | ICD-10-CM | POA: Diagnosis not present

## 2019-01-16 DIAGNOSIS — M75122 Complete rotator cuff tear or rupture of left shoulder, not specified as traumatic: Secondary | ICD-10-CM | POA: Diagnosis not present

## 2019-01-16 DIAGNOSIS — Z20828 Contact with and (suspected) exposure to other viral communicable diseases: Secondary | ICD-10-CM | POA: Insufficient documentation

## 2019-01-16 DIAGNOSIS — E039 Hypothyroidism, unspecified: Secondary | ICD-10-CM | POA: Diagnosis not present

## 2019-01-16 DIAGNOSIS — Z01812 Encounter for preprocedural laboratory examination: Secondary | ICD-10-CM | POA: Insufficient documentation

## 2019-01-16 DIAGNOSIS — Z79890 Hormone replacement therapy (postmenopausal): Secondary | ICD-10-CM | POA: Diagnosis not present

## 2019-01-16 DIAGNOSIS — M7542 Impingement syndrome of left shoulder: Secondary | ICD-10-CM | POA: Diagnosis not present

## 2019-01-16 DIAGNOSIS — M19012 Primary osteoarthritis, left shoulder: Secondary | ICD-10-CM | POA: Diagnosis not present

## 2019-01-16 DIAGNOSIS — M109 Gout, unspecified: Secondary | ICD-10-CM | POA: Diagnosis not present

## 2019-01-16 DIAGNOSIS — K219 Gastro-esophageal reflux disease without esophagitis: Secondary | ICD-10-CM | POA: Diagnosis not present

## 2019-01-16 DIAGNOSIS — I10 Essential (primary) hypertension: Secondary | ICD-10-CM | POA: Diagnosis not present

## 2019-01-16 DIAGNOSIS — Z79899 Other long term (current) drug therapy: Secondary | ICD-10-CM | POA: Diagnosis not present

## 2019-01-16 DIAGNOSIS — Z7982 Long term (current) use of aspirin: Secondary | ICD-10-CM | POA: Diagnosis not present

## 2019-01-16 DIAGNOSIS — J302 Other seasonal allergic rhinitis: Secondary | ICD-10-CM | POA: Diagnosis not present

## 2019-01-16 LAB — COMPREHENSIVE METABOLIC PANEL
ALT: 48 U/L — ABNORMAL HIGH (ref 0–44)
AST: 30 U/L (ref 15–41)
Albumin: 4 g/dL (ref 3.5–5.0)
Alkaline Phosphatase: 59 U/L (ref 38–126)
Anion gap: 11 (ref 5–15)
BUN: 18 mg/dL (ref 8–23)
CO2: 24 mmol/L (ref 22–32)
Calcium: 9.1 mg/dL (ref 8.9–10.3)
Chloride: 101 mmol/L (ref 98–111)
Creatinine, Ser: 1.08 mg/dL (ref 0.61–1.24)
GFR calc Af Amer: >60 mL/min (ref >60)
GFR calc non Af Amer: >60 mL/min (ref >60)
Glucose, Bld: 156 mg/dL — ABNORMAL HIGH (ref 70–99)
Potassium: 3.9 mmol/L (ref 3.5–5.1)
Sodium: 136 mmol/L (ref 135–145)
Total Bilirubin: 1.2 mg/dL (ref 0.3–1.2)
Total Protein: 6.6 g/dL (ref 6.5–8.1)

## 2019-01-16 NOTE — Progress Notes (Signed)

## 2019-01-17 LAB — NOVEL CORONAVIRUS, NAA (HOSP ORDER, SEND-OUT TO REF LAB; TAT 18-24 HRS): SARS-CoV-2, NAA: NOT DETECTED

## 2019-01-19 NOTE — Progress Notes (Unsigned)
Consult Note   Patient: Louis Mccullough             Date of Birth: 24-Apr-1955           MRN: [id number]             Visit Date: 09/02/2018  Requested by: No referring provider defined for this encounter. PCP: Asencion Noble, MD  Thank you for asking me to evaluate this patient for No chief complaint on file.  Below are my findings.     Assessment & Plan: Visit Diagnoses:  1. Screening for malignant neoplasm of respiratory organ     Plan: ***  Follow Up Instructions: No follow-ups on file.  Orders: Orders Placed This Encounter  Procedures  . CT CHEST LUNG CA SCREEN LOW DOSE W/O CM   No orders of the defined types were placed in this encounter.    Procedures: No procedures performed   Clinical Data: No additional findings.   Subjective:  No chief complaint on file.    HPI  Review of Systems   Objective: Vital Signs: There were no vitals taken for this visit.  Physical Exam   Ortho Exam   Specialty Comments:  No specialty comments available.  Imaging:  No results found.   PMFS History: Patient Active Problem List   Diagnosis Date Noted  . Pain in left shoulder 11/28/2018  . Encounter for screening colonoscopy 08/14/2017  . Alcohol abuse, daily use 08/14/2017  . GERD (gastroesophageal reflux disease) 08/29/2010  . Hx of adenomatous colonic polyps 08/29/2010  . FHx: colon cancer 08/29/2010   Past Medical History:  Diagnosis Date  . GERD (gastroesophageal reflux disease)    states he had previous EGD  . Gout   . History of bronchitis   . HTN (hypertension)   . Hx of adenomatous colonic polyps   . Hyperlipidemia   . Hypertriglyceridemia   . Hypothyroidism   . Seasonal allergies   . Shoulder impingement, left   . Wears glasses     Family History  Problem Relation Age of Onset  . Colon cancer Maternal Grandfather 23       died age 21  . Prostate cancer Father 78       died age 39   Past Surgical History:  Procedure Laterality Date  .  BILATERAL CARPAL TUNNEL RELEASE Bilateral 02/12/2014   Procedure: BILATERAL CARPAL TUNNEL RELEASE;  Surgeon: Daryll Brod, MD;  Location: [place];  Service: Orthopedics;  Laterality: Bilateral;  . CHOLECYSTECTOMY    . COLONOSCOPY  2006   adenomatous polyp, diverticulosis  . COLONOSCOPY  09/29/2010   Procedure: COLONOSCOPY;  Surgeon: Daneil Dolin, MD;  [facility];  Service: Endoscopy;  Laterality: N/A;  . COLONOSCOPY WITH PROPOFOL N/A 10/24/2017   Procedure: COLONOSCOPY WITH PROPOFOL;  Surgeon: Daneil Dolin, MD;  [facility];  Service: Endoscopy;  Laterality: N/A;  11:00am  . NECK SURGERY  2001   cerv fusion  . POLYPECTOMY  10/24/2017   Procedure: POLYPECTOMY;  Surgeon: Daneil Dolin, MD;  [facility];  Service: Endoscopy;;  splenic flexure and hepatic flexure, descending colon  . ROTATOR CUFF REPAIR  2006   righrt  . TONSILLECTOMY     Social History   Occupational History    Employer: [employer]  Tobacco Use  . Smoking status: Current Some Day Smoker    Packs/day: 0.30    Types: Cigarettes  . Smokeless tobacco: Never Used  .  Tobacco comment: smokes when he drinks which is daily  Substance and Sexual Activity  . Alcohol use: Yes    Comment: 2-3 drinks a day  . Drug use: No  . Sexual activity: Not on file

## 2019-01-20 ENCOUNTER — Ambulatory Visit (HOSPITAL_BASED_OUTPATIENT_CLINIC_OR_DEPARTMENT_OTHER)
Admission: RE | Admit: 2019-01-20 | Discharge: 2019-01-20 | Disposition: A | Discharge status: Home / Self Care | Payer: No Typology Code available for payment source | Attending: Orthopaedic Surgery | Admitting: Orthopaedic Surgery

## 2019-01-20 ENCOUNTER — Encounter (HOSPITAL_BASED_OUTPATIENT_CLINIC_OR_DEPARTMENT_OTHER)
Admission: RE | Disposition: A | Discharge status: Home / Self Care | Payer: Self-pay | Source: Home / Self Care | Attending: Orthopaedic Surgery

## 2019-01-20 ENCOUNTER — Encounter (HOSPITAL_BASED_OUTPATIENT_CLINIC_OR_DEPARTMENT_OTHER): Payer: Self-pay | Admitting: *Deleted

## 2019-01-20 ENCOUNTER — Ambulatory Visit (HOSPITAL_BASED_OUTPATIENT_CLINIC_OR_DEPARTMENT_OTHER): Payer: No Typology Code available for payment source | Admitting: Certified Registered"

## 2019-01-20 DIAGNOSIS — Z79899 Other long term (current) drug therapy: Secondary | ICD-10-CM | POA: Insufficient documentation

## 2019-01-20 DIAGNOSIS — M75122 Complete rotator cuff tear or rupture of left shoulder, not specified as traumatic: Secondary | ICD-10-CM | POA: Insufficient documentation

## 2019-01-20 DIAGNOSIS — M109 Gout, unspecified: Secondary | ICD-10-CM | POA: Insufficient documentation

## 2019-01-20 DIAGNOSIS — S46222A Laceration of muscle, fascia and tendon of other parts of biceps, left arm, initial encounter: Secondary | ICD-10-CM | POA: Diagnosis not present

## 2019-01-20 DIAGNOSIS — M19012 Primary osteoarthritis, left shoulder: Secondary | ICD-10-CM | POA: Insufficient documentation

## 2019-01-20 DIAGNOSIS — F17210 Nicotine dependence, cigarettes, uncomplicated: Secondary | ICD-10-CM | POA: Insufficient documentation

## 2019-01-20 DIAGNOSIS — Z79890 Hormone replacement therapy (postmenopausal): Secondary | ICD-10-CM | POA: Insufficient documentation

## 2019-01-20 DIAGNOSIS — M7542 Impingement syndrome of left shoulder: Secondary | ICD-10-CM | POA: Insufficient documentation

## 2019-01-20 DIAGNOSIS — E039 Hypothyroidism, unspecified: Secondary | ICD-10-CM | POA: Insufficient documentation

## 2019-01-20 DIAGNOSIS — J302 Other seasonal allergic rhinitis: Secondary | ICD-10-CM | POA: Insufficient documentation

## 2019-01-20 DIAGNOSIS — I10 Essential (primary) hypertension: Secondary | ICD-10-CM | POA: Insufficient documentation

## 2019-01-20 DIAGNOSIS — K219 Gastro-esophageal reflux disease without esophagitis: Secondary | ICD-10-CM | POA: Insufficient documentation

## 2019-01-20 DIAGNOSIS — Z7982 Long term (current) use of aspirin: Secondary | ICD-10-CM | POA: Insufficient documentation

## 2019-01-20 HISTORY — DX: Impingement syndrome of left shoulder: M75.42

## 2019-01-20 HISTORY — PX: SHOULDER ARTHROSCOPY WITH SUBACROMIAL DECOMPRESSION, ROTATOR CUFF REPAIR AND BICEP TENDON REPAIR: SHX5687

## 2019-01-20 HISTORY — DX: Other specified joint disorders, left shoulder: M25.812

## 2019-01-20 SURGERY — SHOULDER ARTHROSCOPY WITH SUBACROMIAL DECOMPRESSION, ROTATOR CUFF REPAIR AND BICEP TENDON REPAIR
Anesthesia: General | Site: Shoulder | Laterality: Left

## 2019-01-20 MED ORDER — CHLORHEXIDINE GLUCONATE 4 % EX LIQD: 60.0000 mL | Freq: Once | CUTANEOUS | Status: DC

## 2019-01-20 MED ORDER — LACTATED RINGERS IV SOLN
INTRAVENOUS | Status: DC
  Administered 2019-01-20: 08:00:00 via INTRAVENOUS

## 2019-01-20 MED ORDER — CEFAZOLIN SODIUM-DEXTROSE 2-4 GM/100ML-% IV SOLN
2.0000 g | INTRAVENOUS | Status: AC
  Administered 2019-01-20: 11:00:00 2 g via INTRAVENOUS

## 2019-01-20 MED ORDER — LIDOCAINE-EPINEPHRINE (PF) 1 %-1:200000 IJ SOLN
INTRAMUSCULAR | Status: AC
  Filled 2019-01-20: qty 30

## 2019-01-20 MED ORDER — CEFAZOLIN SODIUM-DEXTROSE 2-4 GM/100ML-% IV SOLN
INTRAVENOUS | Status: AC
  Filled 2019-01-20: qty 100

## 2019-01-20 MED ORDER — OXYCODONE HCL 5 MG PO TABS: 5.0000 mg | ORAL_TABLET | ORAL | 0 refills | Status: DC | PRN

## 2019-01-20 MED ORDER — PHENYLEPHRINE 40 MCG/ML (10ML) SYRINGE FOR IV PUSH (FOR BLOOD PRESSURE SUPPORT)
PREFILLED_SYRINGE | INTRAVENOUS | Status: AC
  Filled 2019-01-20: qty 10

## 2019-01-20 MED ORDER — LIDOCAINE-EPINEPHRINE 1 %-1:100000 IJ SOLN
INTRAMUSCULAR | Status: AC
  Filled 2019-01-20: qty 1

## 2019-01-20 MED ORDER — MIDAZOLAM HCL 2 MG/2ML IJ SOLN
INTRAMUSCULAR | Status: AC
  Filled 2019-01-20: qty 2

## 2019-01-20 MED ORDER — BUPIVACAINE HCL (PF) 0.5 % IJ SOLN
INTRAMUSCULAR | Status: DC | PRN
  Administered 2019-01-20: 15 mL via PERINEURAL

## 2019-01-20 MED ORDER — FENTANYL CITRATE (PF) 100 MCG/2ML IJ SOLN
INTRAMUSCULAR | Status: AC
  Filled 2019-01-20: qty 2

## 2019-01-20 MED ORDER — ROCURONIUM BROMIDE 100 MG/10ML IV SOLN
INTRAVENOUS | Status: DC | PRN
  Administered 2019-01-20: 60 mg via INTRAVENOUS

## 2019-01-20 MED ORDER — DEXAMETHASONE SODIUM PHOSPHATE 10 MG/ML IJ SOLN
INTRAMUSCULAR | Status: AC
  Filled 2019-01-20: qty 2

## 2019-01-20 MED ORDER — PROPOFOL 10 MG/ML IV BOLUS
INTRAVENOUS | Status: DC | PRN
  Administered 2019-01-20: 170 mg via INTRAVENOUS

## 2019-01-20 MED ORDER — PHENYLEPHRINE HCL (PRESSORS) 10 MG/ML IV SOLN
INTRAVENOUS | Status: AC
  Filled 2019-01-20: qty 1

## 2019-01-20 MED ORDER — PHENYLEPHRINE HCL (PRESSORS) 10 MG/ML IV SOLN
INTRAVENOUS | Status: DC | PRN
  Administered 2019-01-20 (×2): 80 ug via INTRAVENOUS
  Administered 2019-01-20 (×2): 40 ug via INTRAVENOUS
  Administered 2019-01-20: 80 ug via INTRAVENOUS

## 2019-01-20 MED ORDER — FENTANYL CITRATE (PF) 100 MCG/2ML IJ SOLN: 25.0000 ug | INTRAMUSCULAR | Status: DC | PRN

## 2019-01-20 MED ORDER — ONDANSETRON HCL 4 MG/2ML IJ SOLN: 4.0000 mg | Freq: Once | INTRAMUSCULAR | Status: DC | PRN

## 2019-01-20 MED ORDER — OXYCODONE HCL 5 MG/5ML PO SOLN: 5.0000 mg | Freq: Once | ORAL | Status: DC | PRN

## 2019-01-20 MED ORDER — FENTANYL CITRATE (PF) 100 MCG/2ML IJ SOLN: INTRAMUSCULAR | Status: DC | PRN

## 2019-01-20 MED ORDER — BUPIVACAINE LIPOSOME 1.3 % IJ SUSP
INTRAMUSCULAR | Status: DC | PRN
  Administered 2019-01-20: 10 mL

## 2019-01-20 MED ORDER — MIDAZOLAM HCL 2 MG/2ML IJ SOLN
1.0000 mg | INTRAMUSCULAR | Status: DC | PRN
  Administered 2019-01-20: 09:00:00 2 mg via INTRAVENOUS

## 2019-01-20 MED ORDER — EPHEDRINE 5 MG/ML INJ
INTRAVENOUS | Status: AC
  Filled 2019-01-20: qty 10

## 2019-01-20 MED ORDER — OXYCODONE HCL 5 MG PO TABS: 5.0000 mg | ORAL_TABLET | Freq: Once | ORAL | Status: DC | PRN

## 2019-01-20 MED ORDER — FENTANYL CITRATE (PF) 100 MCG/2ML IJ SOLN
50.0000 ug | INTRAMUSCULAR | Status: DC | PRN
  Administered 2019-01-20: 100 ug via INTRAVENOUS

## 2019-01-20 MED ORDER — LIDOCAINE-EPINEPHRINE 1 %-1:100000 IJ SOLN
INTRAMUSCULAR | Status: DC | PRN
  Administered 2019-01-20: 10 mL

## 2019-01-20 MED ORDER — ARTIFICIAL TEARS OPHTHALMIC OINT
TOPICAL_OINTMENT | OPHTHALMIC | Status: AC
  Filled 2019-01-20: qty 7

## 2019-01-20 MED ORDER — DEXAMETHASONE SODIUM PHOSPHATE 10 MG/ML IJ SOLN
INTRAMUSCULAR | Status: DC | PRN
  Administered 2019-01-20: 5 mg via INTRAVENOUS

## 2019-01-20 MED ORDER — ONDANSETRON HCL 4 MG/2ML IJ SOLN
INTRAMUSCULAR | Status: DC | PRN
  Administered 2019-01-20: 4 mg via INTRAVENOUS

## 2019-01-20 MED ORDER — EPHEDRINE SULFATE 50 MG/ML IJ SOLN
INTRAMUSCULAR | Status: DC | PRN
  Administered 2019-01-20 (×2): 10 mg via INTRAVENOUS

## 2019-01-20 MED ORDER — LIDOCAINE-EPINEPHRINE 0.5 %-1:200000 IJ SOLN
INTRAMUSCULAR | Status: AC
  Filled 2019-01-20: qty 1

## 2019-01-20 MED ORDER — ONDANSETRON HCL 4 MG/2ML IJ SOLN
INTRAMUSCULAR | Status: AC
  Filled 2019-01-20: qty 4

## 2019-01-20 MED ORDER — SUGAMMADEX SODIUM 200 MG/2ML IV SOLN
INTRAVENOUS | Status: DC | PRN
  Administered 2019-01-20: 200 mg via INTRAVENOUS

## 2019-01-20 MED FILL — oxyCODONE HCL 5 MG TABS: 5 | 6 days supply | Qty: 40 | Fill #0

## 2019-01-20 SURGICAL SUPPLY — 83 items
ANCHOR SUT 5.5 PEEK (Miscellaneous) ×4 IMPLANT
BAG DECANTER FOR FLEXI CONT (MISCELLANEOUS) IMPLANT
BENZOIN TINCTURE PRP APPL 2/3 (GAUZE/BANDAGES/DRESSINGS) IMPLANT
BLADE AVERAGE 25MMX9MM (BLADE)
BLADE AVERAGE 25X9 (BLADE) IMPLANT
BLADE EXCALIBUR 4.0MM X 13CM (MISCELLANEOUS)
BLADE EXCALIBUR 4.0X13 (MISCELLANEOUS) IMPLANT
BLADE SURG 15 STRL LF DISP TIS (BLADE) IMPLANT
BLADE SURG 15 STRL SS (BLADE) ×2
BURR OVAL 8 FLU 5.0MM X 13CM (MISCELLANEOUS) ×1
BURR OVAL 8 FLU 5.0X13 (MISCELLANEOUS) ×2 IMPLANT
CANNULA 5.75X71 LONG (CANNULA) IMPLANT
CANNULA ACUFLEX KIT 5X76 (CANNULA) ×2 IMPLANT
CANNULA TWIST IN 8.25X7CM (CANNULA) IMPLANT
CLEANER CAUTERY TIP 5X5 PAD (MISCELLANEOUS) IMPLANT
CLOSURE WOUND 1/2 X4 (GAUZE/BANDAGES/DRESSINGS)
COVER WAND RF STERILE (DRAPES) IMPLANT
DECANTER SPIKE VIAL GLASS SM (MISCELLANEOUS) IMPLANT
DISSECTOR 4.0MM X 13CM (MISCELLANEOUS) IMPLANT
DRAPE IMP U-DRAPE 54X76 (DRAPES) ×3 IMPLANT
DRAPE SHOULDER BEACH CHAIR (DRAPES) ×3 IMPLANT
DRAPE SURG 17X23 STRL (DRAPES) ×3 IMPLANT
DRAPE U-SHAPE 47X51 STRL (DRAPES) ×3 IMPLANT
DRSG EMULSION OIL 3X3 NADH (GAUZE/BANDAGES/DRESSINGS) ×6 IMPLANT
DRSG PAD ABDOMINAL 8X10 ST (GAUZE/BANDAGES/DRESSINGS) ×6 IMPLANT
DURAPREP 26ML APPLICATOR (WOUND CARE) ×3 IMPLANT
ELECT NDL TIP 2.8 STRL (NEEDLE) IMPLANT
ELECT NEEDLE TIP 2.8 STRL (NEEDLE) ×3 IMPLANT
ELECT REM PT RETURN 9FT ADLT (ELECTROSURGICAL) ×3
ELECTRODE REM PT RTRN 9FT ADLT (ELECTROSURGICAL) ×1 IMPLANT
GAUZE 4X4 16PLY RFD (DISPOSABLE) IMPLANT
GAUZE SPONGE 4X4 12PLY STRL (GAUZE/BANDAGES/DRESSINGS) ×3 IMPLANT
GLOVE BIOGEL PI IND STRL 7.0 (GLOVE) IMPLANT
GLOVE BIOGEL PI IND STRL 8 (GLOVE) ×1 IMPLANT
GLOVE BIOGEL PI IND STRL 8.5 (GLOVE) ×1 IMPLANT
GLOVE BIOGEL PI INDICATOR 7.0 (GLOVE) ×4
GLOVE BIOGEL PI INDICATOR 8 (GLOVE) ×2
GLOVE BIOGEL PI INDICATOR 8.5 (GLOVE) ×6
GLOVE ECLIPSE 8.0 STRL XLNG CF (GLOVE) ×6 IMPLANT
GOWN STRL REUS W/ TWL LRG LVL3 (GOWN DISPOSABLE) ×1 IMPLANT
GOWN STRL REUS W/ TWL XL LVL3 (GOWN DISPOSABLE) IMPLANT
GOWN STRL REUS W/TWL 2XL LVL3 (GOWN DISPOSABLE) ×3 IMPLANT
GOWN STRL REUS W/TWL LRG LVL3 (GOWN DISPOSABLE) ×2
GOWN STRL REUS W/TWL XL LVL3 (GOWN DISPOSABLE) ×4
IMPL ANCHOR PEEK 4.5MM (Anchor) IMPLANT
IMPLANT ANCHOR PEEK 4.5MM (Anchor) ×6 IMPLANT
MANIFOLD NEPTUNE II (INSTRUMENTS) ×2 IMPLANT
NDL 1/2 CIR CATGUT .05X1.09 (NEEDLE) IMPLANT
NDL SCORPION MULTI FIRE (NEEDLE) IMPLANT
NEEDLE 1/2 CIR CATGUT .05X1.09 (NEEDLE) ×3 IMPLANT
NEEDLE SCORPION MULTI FIRE (NEEDLE) IMPLANT
NS IRRIG 1000ML POUR BTL (IV SOLUTION) IMPLANT
PACK ARTHROSCOPY DSU (CUSTOM PROCEDURE TRAY) ×3 IMPLANT
PACK BASIN DAY SURGERY FS (CUSTOM PROCEDURE TRAY) ×3 IMPLANT
PAD CLEANER CAUTERY TIP 5X5 (MISCELLANEOUS) ×2
PENCIL SMOKE EVACUATOR (MISCELLANEOUS) IMPLANT
PORT APPOLLO RF 90DEGREE MULTI (SURGICAL WAND) ×3 IMPLANT
RESTRAINT HEAD UNIVERSAL NS (MISCELLANEOUS) ×3 IMPLANT
SLEEVE SCD COMPRESS KNEE MED (MISCELLANEOUS) ×3 IMPLANT
SLING ARM FOAM STRAP LRG (SOFTGOODS) IMPLANT
SLING ARM IMMOBILIZER XL (CAST SUPPLIES) ×2 IMPLANT
SPONGE LAP 4X18 RFD (DISPOSABLE) ×6 IMPLANT
STAPLER VISISTAT 35W (STAPLE) IMPLANT
STRIP CLOSURE SKIN 1/2X4 (GAUZE/BANDAGES/DRESSINGS) IMPLANT
SUCTION FRAZIER HANDLE 10FR (MISCELLANEOUS)
SUCTION TUBE FRAZIER 10FR DISP (MISCELLANEOUS) IMPLANT
SUT BONE WAX W31G (SUTURE) IMPLANT
SUT ETHIBOND 2-0 V-5 NDL (SUTURE) IMPLANT
SUT ETHIBOND 2-0 V-5 NEEDLE (SUTURE) IMPLANT
SUT ETHILON 3 0 PS 1 (SUTURE) IMPLANT
SUT ETHILON 4 0 PS 2 18 (SUTURE) IMPLANT
SUT MNCRL AB 3-0 PS2 18 (SUTURE) ×2 IMPLANT
SUT PROLENE 3 0 PS 2 (SUTURE) IMPLANT
SUT VIC AB 0 CT1 27 (SUTURE) ×4
SUT VIC AB 0 CT1 27XBRD ANBCTR (SUTURE) IMPLANT
SUT VIC AB 0 SH 27 (SUTURE) IMPLANT
SUT VIC AB 2-0 SH 27 (SUTURE) ×2
SUT VIC AB 2-0 SH 27XBRD (SUTURE) IMPLANT
SYR BULB 3OZ (MISCELLANEOUS) IMPLANT
TOWEL GREEN STERILE FF (TOWEL DISPOSABLE) ×3 IMPLANT
TUBING ARTHROSCOPY IRRIG 16FT (MISCELLANEOUS) ×3 IMPLANT
WATER STERILE IRR 1000ML POUR (IV SOLUTION) ×3 IMPLANT
YANKAUER SUCT BULB TIP NO VENT (SUCTIONS) IMPLANT

## 2019-01-20 NOTE — Discharge Instructions (Signed)

## 2019-01-20 NOTE — Anesthesia Postprocedure Evaluation (Signed)
Anesthesia Post Note  Patient: Louis Mccullough  Procedure(s) Performed: left shoulder arthroscopy, subacromial decompression, distal clavicle resection, mini open rotator cuff repair (Left Shoulder)     Patient location during evaluation: PACU Anesthesia Type: General Level of consciousness: awake and alert Pain management: pain level controlled Vital Signs Assessment: post-procedure vital signs reviewed and stable Respiratory status: spontaneous breathing, nonlabored ventilation and respiratory function stable Cardiovascular status: blood pressure returned to baseline and stable Postop Assessment: no apparent nausea or vomiting Anesthetic complications: no    Last Vitals:  Vitals:   01/20/19 1300 01/20/19 1315  BP: 124/75 136/86  Pulse: 95 91  Resp: (!) 25 (!) 24  Temp:    SpO2: 97% 96%    Last Pain:  Vitals:   01/20/19 1315  TempSrc:   PainSc: 0-No pain                 Lidia Collum

## 2019-01-20 NOTE — Progress Notes (Signed)
Assisted Dr. Witman with left, ultrasound guided, interscalene  block. Side rails up, monitors on throughout procedure. See vital signs in flow sheet. Tolerated Procedure well. 

## 2019-01-20 NOTE — Anesthesia Procedure Notes (Signed)
Anesthesia Regional Block: Interscalene brachial plexus block   Pre-Anesthetic Checklist: ,, timeout performed, Correct Patient, Correct Site, Correct Laterality, Correct Procedure, Correct Position, site marked, Risks and benefits discussed,  Surgical consent,  Pre-op evaluation,  At surgeon's request and post-op pain management  Laterality: Left  Prep: chloraprep       Needles:  Injection technique: Single-shot  Needle Type: Echogenic Stimulator Needle     Needle Length: 9cm  Needle Gauge: 21     Additional Needles:   Procedures:,,,, ultrasound used (permanent image in chart),,,,  Narrative:  Start time: 01/20/2019 8:50 AM End time: 01/20/2019 8:55 AM Injection made incrementally with aspirations every 5 mL.  Performed by: Personally  Anesthesiologist: Lidia Collum, MD  Additional Notes: Monitors applied. Injection made in 5cc increments. No resistance to injection. Good needle visualization. Patient tolerated procedure well.

## 2019-01-20 NOTE — Op Note (Signed)
NAME: Louis Mccullough, Louis Mccullough. MEDICAL RECORD NO:[id number] ACCOUNT NO.:[id number] DATE OF BIRTH:01/07/1956 FACILITY: [facility] LOCATION: [facility] PHYSICIAN:PETER W. WHITFIELD, MD  OPERATIVE REPORT  DATE OF PROCEDURE:  01/20/2019  PREOPERATIVE DIAGNOSES: 1.  Tear of rotator cuff, left shoulder. 2.  Impingement syndrome, left shoulder. 3.  Degenerative arthritis acromioclavicular joint, left shoulder.  POSTOPERATIVE DIAGNOSES: 1.  Tear of rotator cuff, left shoulder. 2.  Impingement syndrome, left shoulder. 3.  Degenerative arthritis acromioclavicular joint, left shoulder, with tear of biceps tendon.  PROCEDURE: 1.  Diagnostic arthroscopy, left shoulder with debridement of biceps tendon and synovitis. 2.  Arthroscopic subacromial decompression. 3.  Arthroscopic distal clavicle resection. 4.  Mini open rotator cuff tear repair and biceps tenodesis.  SURGEON:  Peter Whitfield, MD  ASSISTANT:  Brian Petrarca, PA-C, who was present throughout the operative procedure to ensure its timely completion.  ANESTHESIA:  General with supplemental Exparel interscalene nerve block.  COMPLICATIONS:  None.  HISTORY:  A 63-year-old gentleman has had recurrent problems with his left shoulder for many months.  He has had cortisone injection, exercises and over-the-counter medicines with persistent pain with sleeping and with overhead activities.  He has had a  prior rotator cuff tear repair of his right shoulder and has done well.  I did obtain an MRI scan that revealed a complete tear of the supra infraspinatus with retraction about 2.5-3 cm associated with a bulky acromioclavicular joint and a type 2  acromion.  He is now to have an arthroscopic procedure with a rotator cuff tear repair.  DESCRIPTION OF PROCEDURE:  The patient was met in the holding area and identified the left shoulder as appropriate operative site and marked it accordingly.  Anesthesia performed an interscalene nerve block.  The patient was  then transported to room #5 and carefully placed under general endotracheal anesthesia without difficulty.  He was then placed in the semi-sitting position with the shoulder frame.  Timeout was called.  I examined the shoulder under anesthesia without evidence of instability or adhesive capsulitis.  The left shoulder was then prepared with chlorhexidine scrub and DuraPrep and the base of the neck circumferentially to below the elbow.  Sterile draping was performed.  Timeout was called again.  Marking pen was used to outline the AC joint, the coracoid and acromion.  At one fingerbreadth posterior and medial to the posterior angle acromion, a small stab wound was made.  Arthroscope was then easily placed into the shoulder joint.  There was a  small clear yellow joint effusion.  There was an obvious tear of the infra and supraspinatus with rounded edges and retraction.  I did not see any appreciable chondromalacia of the humeral head or the glenoid.  Biceps tendon was torn right at its attachment of at least 50% of its width.   There were no loose bodies.  There was a moderate amount of beefy red synovitis.  A second portal was established anteriorly.  I performed release of the biceps tendon and a synovectomy.  The arthroscope was then placed in the subacromial space posteriorly, the cannula in the subacromial space anteriorly and a third portal established in the lateral subacromial space.  Arthroscopic subacromial decompression was performed.  There was a moderate amount of beefy red i.e., inflammatory bursal tissue.  This was debrided with the Arthrocare wand and a Cuda shaver.  There was obvious anterior and to some extent lateral  overhang of the acromion.  I performed an anterior and lateral acromioplasty with a nice flat resection.    There were obvious degenerative changes about the [facility] joint with a bulky distal clavicle impinging the cuff and synovitis in the joint.  Distal  clavicle resection  was performed with the same 6 mm bur.  At that point, I applied a new set of gloves and performed a mini open rotator cuff tear repair and biceps tenodesis.  About an inch to inch and a half incision was made along the anterior aspect of the shoulder in line with the biceps tendon.  Incision  was carried through the subcutaneous tissue and a raphae in the deltoid fascia.  The subacromial space was entered.  The biceps tendon was retrieved.  It was tenodesed with a 5.5 mm PEEK anchor in its distal groove.  I then evaluated the rotator cuff.  I was able to advance it to its normal position on the humeral head.  I inserted a single 5.5 mm PEEK anchor with a  nice repair on bleeding bone.  I performed a second row repair with a Quattro 4.5 mm anchor.  I thought I had a very nice repair.  There was one small area far laterally involving the supraspinatus that was still torn and I repaired that tendon to tendon  with 0 Vicryl stitch.  I also repaired the biceps retinaculum with a running 0 Vicryl.  The wound was irrigated with saline solution.  The deltoid fascia closed with a running 0 Vicryl, the subcutaneous with 3-0 Monocryl and the skin incision was closed  with Steri-Strips over benzoin.  A sterile bulky dressing was applied followed by a sling.  PLAN:  Oxycodone for pain.  Office in 1 week.  TN/NUANCE  D:01/20/2019 T:01/20/2019 JOB:[id number]

## 2019-01-20 NOTE — Transfer of Care (Signed)
Immediate Anesthesia Transfer of Care Note  Patient: Louis Mccullough  Procedure(s) Performed: left shoulder arthroscopy, subacromial decompression, distal clavicle resection, mini open rotator cuff repair (Left Shoulder)  Patient Location: PACU  Anesthesia Type:GA combined with regional for post-op pain  Level of Consciousness: drowsy  Airway & Oxygen Therapy: Patient Spontanous Breathing and Patient connected to face mask oxygen  Post-op Assessment: Report given to RN and Post -op Vital signs reviewed and stable  Post vital signs: Reviewed and stable  Last Vitals:  Vitals Value Taken Time  BP 126/100 01/20/19 1233  Temp    Pulse 99 01/20/19 1237  Resp 22 01/20/19 1237  SpO2 100 % 01/20/19 1237  Vitals shown include unvalidated device data.  Last Pain:  Vitals:   01/20/19 0813  TempSrc: Oral  PainSc: 0-No pain         Complications: No apparent anesthesia complications

## 2019-01-20 NOTE — Op Note (Signed)
PATIENT ID:      KEMANI WAGGY  MRN:     [id number] DOB/AGE:    12/09/1955 / 63 y.o.       OPERATIVE REPORT    DATE OF PROCEDURE:  01/20/2019       PREOPERATIVE DIAGNOSIS:   left shoulder impingement, rotator cuff tear, acromioclavicular osteoarthritis                                                       Estimated body mass index is 30.41 kg/m as calculated from the following:   Height as of this encounter: 5\' 9"  (1.753 m).   Weight as of this encounter: 93.4 kg.     POSTOPERATIVE DIAGNOSIS:   Same with tear of biceps tendon                                                                    Estimated body mass index is 30.41 kg/m as calculated from the following:   Height as of this encounter: 5\' 9"  (1.753 m).   Weight as of this encounter: 93.4 kg.     PROCEDURE:  Procedure(s): left shoulder arthroscopy, arthroscopicsubacromial decompression,arthroscopic distal clavicle resection, mini open rotator cuff repair, biceps tenodesis     SURGEON:  Joni Fears, MD    ASSISTANT:   Biagio Borg, PA-C   (Present and scrubbed throughout the case, critical for assistance with exposure, retraction, instrumentation, and closure.)          ANESTHESIA: regional and general     DRAINS: none :      TOURNIQUET TIME: * No tourniquets in log *    COMPLICATIONS:  None   CONDITION:  stable  PROCEDURE IN DETAIL:    Garald Balding 01/20/2019, 12:09 PM

## 2019-01-20 NOTE — H&P (Signed)
The recent History & Physical has been reviewed. I have personally examined the patient today. There is no interval change to the documented History & Physical. The patient would like to proceed with the procedure.  Garald Balding 01/20/2019,  9:57 AM

## 2019-01-20 NOTE — Anesthesia Preprocedure Evaluation (Addendum)
Anesthesia Evaluation  Patient identified by MRN, date of birth, ID band Patient awake    Reviewed: Allergy & Precautions, NPO status , Patient's Chart, lab work & pertinent test results  History of Anesthesia Complications Negative for: history of anesthetic complications  Airway Mallampati: II  TM Distance: >3 FB Neck ROM: Full    Dental   Pulmonary Current Smoker and Patient abstained from smoking.,    Pulmonary exam normal        Cardiovascular hypertension, Pt. on medications Normal cardiovascular exam     Neuro/Psych negative neurological ROS  negative psych ROS   GI/Hepatic GERD  Medicated,(+)     substance abuse  alcohol use,   Endo/Other  Hypothyroidism   Renal/GU negative Renal ROS  negative genitourinary   Musculoskeletal  (+) Arthritis ,   Abdominal   Peds  Hematology negative hematology ROS (+)   Anesthesia Other Findings   Reproductive/Obstetrics                            Anesthesia Physical Anesthesia Plan  ASA: II  Anesthesia Plan: General   Post-op Pain Management: GA combined w/ Regional for post-op pain   Induction: Intravenous  PONV Risk Score and Plan: 1 and Ondansetron, Dexamethasone, Treatment may vary due to age or medical condition and Midazolam  Airway Management Planned: Oral ETT  Additional Equipment: None  Intra-op Plan:   Post-operative Plan: Extubation in OR  Informed Consent: I have reviewed the patients History and Physical, chart, labs and discussed the procedure including the risks, benefits and alternatives for the proposed anesthesia with the patient or authorized representative who has indicated his/her understanding and acceptance.     Dental advisory given  Plan Discussed with:   Anesthesia Plan Comments:        Anesthesia Quick Evaluation

## 2019-01-20 NOTE — Anesthesia Procedure Notes (Signed)
Procedure Name: Intubation Date/Time: 01/20/2019 10:44 AM Performed by: Lavonia Dana, CRNA Pre-anesthesia Checklist: Patient identified, Emergency Drugs available, Suction available and Patient being monitored Patient Re-evaluated:Patient Re-evaluated prior to induction Oxygen Delivery Method: Circle system utilized Preoxygenation: Pre-oxygenation with 100% oxygen Induction Type: IV induction Ventilation: Mask ventilation without difficulty and Oral airway inserted - appropriate to patient size Laryngoscope Size: Mac and 4 Grade View: Grade II Tube type: Oral Tube size: 7.0 mm Number of attempts: 1 Airway Equipment and Method: Stylet and Oral airway Placement Confirmation: ETT inserted through vocal cords under direct vision,  positive ETCO2 and breath sounds checked- equal and bilateral Tube secured with: Tape Dental Injury: Teeth and Oropharynx as per pre-operative assessment  Comments: DL x1 with Miller 2, Grade III view. DL x1 with Mac 4, Grade III view. DL x1 by MD with Grade IIb view.

## 2019-01-21 ENCOUNTER — Encounter (HOSPITAL_BASED_OUTPATIENT_CLINIC_OR_DEPARTMENT_OTHER): Payer: Self-pay | Admitting: Orthopaedic Surgery

## 2019-01-27 ENCOUNTER — Encounter: Payer: Self-pay | Admitting: Orthopaedic Surgery

## 2019-01-27 ENCOUNTER — Ambulatory Visit (INDEPENDENT_AMBULATORY_CARE_PROVIDER_SITE_OTHER): Payer: No Typology Code available for payment source | Admitting: Orthopaedic Surgery

## 2019-01-27 ENCOUNTER — Other Ambulatory Visit: Payer: Self-pay

## 2019-01-27 DIAGNOSIS — Z9889 Other specified postprocedural states: Secondary | ICD-10-CM | POA: Insufficient documentation

## 2019-01-27 NOTE — Progress Notes (Signed)
Office Visit Note   Patient: Louis Mccullough           Date of Birth: January 18, 1956           MRN: [id number] Visit Date: 01/27/2019              Requested by: Asencion Noble, MD [address] [place],  [place] [address] PCP: Asencion Noble, MD   Assessment & Plan: Visit Diagnoses:  1. Status post left rotator cuff repair     Plan:  #1: At this time we will set him up with the physical therapy as per protocol for massive rotator cuff tear.  He will have his physical therapy here #2: Continue with the sling and no motion to the left shoulder. #3: Follow back up 2 weeks for recheck evaluation.   Follow-Up Instructions: Return in about 2 weeks (around 02/10/2019).   Orders:  No orders of the defined types were placed in this encounter.  No orders of the defined types were placed in this encounter.     Procedures: No procedures performed   Clinical Data: No additional findings.   Subjective: Chief Complaint  Patient presents with  . Left Shoulder - Routine Post Op    Left shoulder scope DOS 01/20/2019  Patient presents today for follow up on his left shoulder. He had a left shoulder arthroscopy with SAD, DCR, and rotator cuff tear repair on 01/20/2019. He is now one week out from surgery. He said that he is doing okay. He has not taken anything for pain in a couple days.  HPI  Review of Systems  Constitutional: Negative for fatigue.  HENT: Negative for ear pain.   Eyes: Negative for pain.  Respiratory: Negative for shortness of breath.   Cardiovascular: Negative for leg swelling.  Gastrointestinal: Negative for constipation and diarrhea.  Endocrine: Negative for cold intolerance and heat intolerance.  Genitourinary: Negative for difficulty urinating.  Musculoskeletal: Negative for joint swelling.  Skin: Negative for rash.  Allergic/Immunologic: Negative for food allergies.  Neurological: Negative for weakness.  Hematological: Does not bruise/bleed easily.   Psychiatric/Behavioral: Negative for sleep disturbance.     Objective: Vital Signs: Ht 5\' 9"  (1.753 m)   Wt 205 lb (93 kg)   BMI 30.27 kg/m   Physical Exam Constitutional:      Appearance: He is well-developed.  Eyes:     Pupils: Pupils are equal, round, and reactive to light.  Pulmonary:     Effort: Pulmonary effort is normal.  Skin:    General: Skin is warm and dry.  Neurological:     Mental Status: He is alert and oriented to person, place, and time.  Psychiatric:        Behavior: Behavior normal.     Ortho Exam  Examination today reveals his wound to be healing per primam with no signs of infection.  Passively I can bring him to about 80 degrees of abduction and forward flexion.  Good grip strength.  Good pulse.  Specialty Comments:  No specialty comments available.  Imaging: No results found.   PMFS History: Current Outpatient Medications  Medication Sig Dispense Refill  . allopurinol (ZYLOPRIM) 300 MG tablet Take 300 mg by mouth daily.      Marland Kitchen amLODipine (NORVASC) 10 MG tablet Take 10 mg by mouth daily.      Marland Kitchen aspirin 81 MG tablet Take 81 mg by mouth daily.    . fluticasone (FLONASE) 50 MCG/ACT nasal spray Place 2 sprays into both  nostrils daily as needed for allergies.     . hydrochlorothiazide (HYDRODIURIL) 25 MG tablet Take 25 mg by mouth daily.    Marland Kitchen levothyroxine (SYNTHROID, LEVOTHROID) 112 MCG tablet Take 112 mcg by mouth daily.  4  . methocarbamol (ROBAXIN) 500 MG tablet Take 500 mg by mouth daily as needed for muscle spasms.   4  . MICARDIS 80 MG tablet Take 80 mg by mouth daily.     Marland Kitchen oxyCODONE (ROXICODONE) 5 MG immediate release tablet Take 1 tablet (5 mg total) by mouth every 4 (four) hours as needed for moderate pain or severe pain. DO NOT USE Tramadol when taking this prescription 40 tablet 0  . pantoprazole (PROTONIX) 40 MG tablet Take 40 mg by mouth daily.  4  . simvastatin (ZOCOR) 20 MG tablet Take 20 mg by mouth at bedtime.       No current  facility-administered medications for this visit.     Patient Active Problem List   Diagnosis Date Noted  . Status post left rotator cuff repair 01/27/2019  . Pain in left shoulder 11/28/2018  . Encounter for screening colonoscopy 08/14/2017  . Alcohol abuse, daily use 08/14/2017  . GERD (gastroesophageal reflux disease) 08/29/2010  . Hx of adenomatous colonic polyps 08/29/2010  . FHx: colon cancer 08/29/2010   Past Medical History:  Diagnosis Date  . GERD (gastroesophageal reflux disease)    states he had previous EGD  . Gout   . History of bronchitis   . HTN (hypertension)   . Hx of adenomatous colonic polyps   . Hyperlipidemia   . Hypertriglyceridemia   . Hypothyroidism   . Seasonal allergies   . Shoulder impingement, left   . Wears glasses     Family History  Problem Relation Age of Onset  . Colon cancer Maternal Grandfather 63       died age 100  . Prostate cancer Father 54       died age 40    Past Surgical History:  Procedure Laterality Date  . BILATERAL CARPAL TUNNEL RELEASE Bilateral 02/12/2014   Procedure: BILATERAL CARPAL TUNNEL RELEASE;  Surgeon: Daryll Brod, MD;  Location: [place];  Service: Orthopedics;  Laterality: Bilateral;  . CHOLECYSTECTOMY    . COLONOSCOPY  2006   adenomatous polyp, diverticulosis  . COLONOSCOPY  09/29/2010   Procedure: COLONOSCOPY;  Surgeon: Daneil Dolin, MD;  Location: [facility];  Service: Endoscopy;  Laterality: N/A;  . COLONOSCOPY WITH PROPOFOL N/A 10/24/2017   Procedure: COLONOSCOPY WITH PROPOFOL;  Surgeon: Daneil Dolin, MD;  Location: [facility];  Service: Endoscopy;  Laterality: N/A;  11:00am  . NECK SURGERY  2001   cerv fusion  . POLYPECTOMY  10/24/2017   Procedure: POLYPECTOMY;  Surgeon: Daneil Dolin, MD;  Location: [facility];  Service: Endoscopy;;  splenic flexure and hepatic flexure, descending colon  . ROTATOR CUFF REPAIR  2006   righrt  . SHOULDER ARTHROSCOPY WITH SUBACROMIAL  DECOMPRESSION, ROTATOR CUFF REPAIR AND BICEP TENDON REPAIR Left 01/20/2019   Procedure: left shoulder arthroscopy, subacromial decompression, distal clavicle resection, mini open rotator cuff repair;  Surgeon: Garald Balding, MD;  Location: [place];  Service: Orthopedics;  Laterality: Left;  . TONSILLECTOMY     Social History   Occupational History    Employer: [employer]  Tobacco Use  . Smoking status: Current Some Day Smoker    Packs/day: 0.30    Types: Cigarettes  . Smokeless tobacco: Never  Used  . Tobacco comment: smokes when he drinks which is daily  Substance and Sexual Activity  . Alcohol use: Yes    Comment: 2-3 drinks a day  . Drug use: No  . Sexual activity: Not on file

## 2019-01-28 ENCOUNTER — Telehealth: Payer: Self-pay | Admitting: Orthopaedic Surgery

## 2019-01-28 ENCOUNTER — Ambulatory Visit: Payer: No Typology Code available for payment source | Admitting: Physical Therapy

## 2019-01-28 ENCOUNTER — Encounter: Payer: Self-pay | Admitting: Physical Therapy

## 2019-01-28 DIAGNOSIS — R600 Localized edema: Secondary | ICD-10-CM

## 2019-01-28 DIAGNOSIS — M25612 Stiffness of left shoulder, not elsewhere classified: Secondary | ICD-10-CM | POA: Diagnosis not present

## 2019-01-28 DIAGNOSIS — R293 Abnormal posture: Secondary | ICD-10-CM

## 2019-01-28 DIAGNOSIS — M25512 Pain in left shoulder: Secondary | ICD-10-CM

## 2019-01-28 NOTE — Telephone Encounter (Signed)
Rotator cuff tear repair-passive ROM for 6 weeks

## 2019-01-28 NOTE — Telephone Encounter (Signed)
[name] with PT, needs a protocol for patient's EVAL today.

## 2019-01-28 NOTE — Telephone Encounter (Signed)
Spoke with Colletta [place] and relayed information.

## 2019-01-28 NOTE — Telephone Encounter (Signed)
Please advise 

## 2019-01-28 NOTE — Patient Instructions (Signed)
Access Code: [email or web address]  URL: [email or web address]  Date: 01/28/2019  Prepared by: Faustino Congress   Exercises  Circular Shoulder Pendulum with Table Support - 10 reps - 1 sets - 3x daily - 7x weekly  Horizontal Shoulder Pendulum with Table Support - 10 reps - 1 sets - 3x daily - 7x weekly

## 2019-01-28 NOTE — Therapy (Signed)
[facility] Physical Therapy [address] [place], [place], [address] Phone: [phone]   Fax:  [phone]  Physical Therapy Evaluation  Patient Details  Name: Louis Mccullough MRN: [id number] Date of Birth: 1955-06-11 Referring Provider (PT): Cherylann Ratel, [place]   Encounter Date: 01/28/2019  PT End of Session - 01/28/19 1051    Visit Number  1    Number of Visits  30    Date for PT Re-Evaluation  04/22/19    PT Start Time  [id number]    PT Stop Time  1057    PT Time Calculation (min)  42 min    Activity Tolerance  Patient tolerated treatment well    Behavior During Therapy  [facility] for tasks assessed/performed       Past Medical History:  Diagnosis Date  . GERD (gastroesophageal reflux disease)    states he had previous EGD  . Gout   . History of bronchitis   . HTN (hypertension)   . Hx of adenomatous colonic polyps   . Hyperlipidemia   . Hypertriglyceridemia   . Hypothyroidism   . Seasonal allergies   . Shoulder impingement, left   . Wears glasses     Past Surgical History:  Procedure Laterality Date  . BILATERAL CARPAL TUNNEL RELEASE Bilateral 02/12/2014   Procedure: BILATERAL CARPAL TUNNEL RELEASE;  Surgeon: Daryll Brod, MD;  Location: [place];  Service: Orthopedics;  Laterality: Bilateral;  . CHOLECYSTECTOMY    . COLONOSCOPY  2006   adenomatous polyp, diverticulosis  . COLONOSCOPY  09/29/2010   Procedure: COLONOSCOPY;  Surgeon: Daneil Dolin, MD;  [facility];  Service: Endoscopy;  Laterality: N/A;  . COLONOSCOPY WITH PROPOFOL N/A 10/24/2017   Procedure: COLONOSCOPY WITH PROPOFOL;  Surgeon: Daneil Dolin, MD;  [facility];  Service: Endoscopy;  Laterality: N/A;  11:00am  . NECK SURGERY  2001   cerv fusion  . POLYPECTOMY  10/24/2017   Procedure: POLYPECTOMY;  Surgeon: Daneil Dolin, MD;  [facility];  Service: Endoscopy;;  splenic flexure and hepatic flexure, descending colon  . ROTATOR CUFF  REPAIR  2006   righrt  . SHOULDER ARTHROSCOPY WITH SUBACROMIAL DECOMPRESSION, ROTATOR CUFF REPAIR AND BICEP TENDON REPAIR Left 01/20/2019   Procedure: left shoulder arthroscopy, subacromial decompression, distal clavicle resection, mini open rotator cuff repair;  Surgeon: Garald Balding, MD;  Location: [place];  Service: Orthopedics;  Laterality: Left;  . TONSILLECTOMY      There were no vitals filed for this visit.   Subjective Assessment - 01/28/19 1018    Subjective  Pt is a 63 y/o male who presents to OPPT s/p Lt RTC repair on 01/20/2019.  Pt with hx of Rt RTC repair.  Pt presents today with continued pain, especially after nerve block wore off, with improvment in pain today.    Pertinent History  Rt RTC repair    Limitations  Lifting    Patient Stated Goals  improve function and strength    Currently in Pain?  Yes    Pain Score  0-No pain   up to 8/10   Pain Location  Shoulder    Pain Orientation  Left    Pain Descriptors / Indicators  Sore;Sharp    Pain Type  Acute pain;Surgical pain    Pain Onset  1 to 4 weeks ago    Pain Frequency  Intermittent    Aggravating Factors   movement    Pain Relieving  Factors  time, rest, ice         [facility] PT Assessment - 01/28/19 1022      Assessment   Medical Diagnosis  Lt RTC repair    Referring Provider (PT)  Cherylann Ratel, PA-C    Onset Date/Surgical Date  01/20/19    Hand Dominance  Right    Next MD Visit  02/17/19    Prior Therapy  following Rt RTC repair (2008)      Precautions   Precautions  Shoulder    Type of Shoulder Precautions  no active motion x 6 weeks    Shoulder Interventions  Shoulder sling/immobilizer      Restrictions   Weight Bearing Restrictions  No      Balance Screen   Has the patient fallen in the past 6 months  No    Has the patient had a decrease in activity level because of a fear of falling?   No    Is the patient reluctant to leave their home because of a fear of falling?    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[employer], some hunting; has a place at [employer]   Overall Cognitive Status  Within Functional Limits for tasks assessed      Observation/Other Assessments   Observations  increased edema noted in Lt shoulder      Posture/Postural Control   Posture/Postural Control  Postural limitations    Postural Limitations  Rounded Shoulders;Forward head;Increased thoracic kyphosis      ROM / Strength   AROM / PROM / Strength  PROM;AROM      AROM   Overall AROM Comments  tested in sitting    AROM Assessment Site  Shoulder    Right/Left Shoulder  Right    Right Shoulder Flexion  165 Degrees    Right Shoulder ABduction  140 Degrees    Right Shoulder Internal Rotation  --   FIR to T10/T11   Right Shoulder External Rotation  --   FER WNL     PROM   PROM Assessment Site  Shoulder    Right/Left Shoulder  Left    Left Shoulder Flexion  48 Degrees    Left Shoulder ABduction  60 Degrees    Left Shoulder Internal Rotation  --   to abdomen in ~ 20 deg abduction   Left Shoulder External Rotation  0 Degrees                Objective measurements completed on examination: See above findings.      [facility] Adult PT Treatment/Exercise - 01/28/19 1022      Exercises   Exercises  Shoulder      Shoulder Exercises: ROM/Strengthening   Pendulum  Lt shoulder; lateral and circles x 10 each direction      Modalities   Modalities  Vasopneumatic      Vasopneumatic   Number Minutes Vasopneumatic   10 minutes    Vasopneumatic Pressure  Low    Vasopneumatic Temperature   34 deg      Manual Therapy  Manual Therapy  Passive ROM    Passive ROM   gentle Lt shoulder flexion/abduction to tolerance             PT Education - 01/28/19 1047    Education Details  HEP    Person(s) Educated  Patient    Methods  Explanation;Demonstration;Handout    Comprehension  Verbalized understanding;Returned demonstration;Need further instruction       PT Short Term Goals - 01/28/19 1300      PT SHORT TERM GOAL #1   Title  independent with initial HEP    Status  New    Target Date  03/11/19      PT SHORT TERM GOAL #2   Title  demonstrate improved PROM Lt shoulder by at least 20 degrees flexion, abduction, external rotation for improved function    Status  New    Target Date  03/11/19        PT Long Term Goals - 01/28/19 1301      PT LONG TERM GOAL #1   Title  independent with advanced HEP    Status  New    Target Date  04/22/19      PT LONG TERM GOAL #2   Title  demonstrate Lt shoulder AROM at least 75% of Rt shoulder for improved function    Status  New    Target Date  04/22/19      PT LONG TERM GOAL #3   Title  report pain < 4/10 with activity for improved function    Status  New    Target Date  04/22/19      PT LONG TERM GOAL #4   Title  strengthening program initated and progressed in order to progress strength    Status  New    Target Date  04/22/19             Plan - 01/28/19 1257    Clinical Impression Statement  Pt is a 63 y/o male who presents to OPPT s/p Lt RTC repair on 01/20/19.  Pt demonstrates postural abnormalities, decreased ROM and strength as well as pain affecting functional mobility.  Pt will benefit from PT to address deficits listed.    Personal Factors and Comorbidities  Past/Current Experience;Other;Comorbidity 1   severity of injury/repair   Comorbidities  HTN    Examination-Activity Limitations  Bathing;Bed Mobility;Toileting;Reach Overhead;Self Feeding;Carry;Sleep;Hygiene/Grooming;Dressing    Examination-Participation Restrictions  Other;Driving   occupation   Stability/Clinical  Decision Making  Evolving/Moderate complexity    Clinical Decision Making  Moderate    Rehab Potential  Good    PT Frequency  3x / week    PT Duration  12 weeks   3x/wk x 4-6 wks; then 2x/wk   PT Treatment/Interventions  ADLs/Self Care Home Management;Cryotherapy;Electrical Stimulation;Ultrasound;Moist Heat;Therapeutic exercise;Patient/family education;Manual techniques;Scar mobilization;Vasopneumatic Device;Taping;Passive range of motion;Dry needling    PT Next Visit Plan  review pendulums, PROM to tolerance; modalities PRN for pain    PT Home Exercise Plan  Access Code: [place]       Patient will benefit from skilled therapeutic intervention in order to improve the following deficits and impairments:  Pain, Postural dysfunction, Increased edema, Impaired UE functional use, Decreased strength, Decreased range of motion  Visit Diagnosis: Acute pain of left shoulder - Plan: PT plan of care cert/re-cert  Stiffness of left shoulder, not elsewhere classified - Plan: PT plan of care cert/re-cert  Abnormal posture - Plan: PT plan of care cert/re-cert  Localized edema - Plan: PT plan of  care cert/re-cert     Problem List Patient Active Problem List   Diagnosis Date Noted  . Status post left rotator cuff repair 01/27/2019  . Pain in left shoulder 11/28/2018  . Encounter for screening colonoscopy 08/14/2017  . Alcohol abuse, daily use 08/14/2017  . GERD (gastroesophageal reflux disease) 08/29/2010  . Hx of adenomatous colonic polyps 08/29/2010  . FHx: colon cancer 08/29/2010      Laureen Abrahams, PT, DPT 01/28/19 1:06 PM     [facility] Physical Therapy [address] [place], [place], [address] Phone: [phone]   Fax:  [phone]  Name: RAKEIM WARHURST MRN: [id number] Date of Birth: [birth year]

## 2019-02-02 ENCOUNTER — Other Ambulatory Visit: Payer: Self-pay

## 2019-02-02 ENCOUNTER — Encounter: Payer: Self-pay | Admitting: Physical Therapy

## 2019-02-02 ENCOUNTER — Ambulatory Visit (INDEPENDENT_AMBULATORY_CARE_PROVIDER_SITE_OTHER): Payer: No Typology Code available for payment source | Admitting: Physical Therapy

## 2019-02-02 DIAGNOSIS — M25612 Stiffness of left shoulder, not elsewhere classified: Secondary | ICD-10-CM | POA: Diagnosis not present

## 2019-02-02 DIAGNOSIS — M25512 Pain in left shoulder: Secondary | ICD-10-CM

## 2019-02-02 DIAGNOSIS — R600 Localized edema: Secondary | ICD-10-CM

## 2019-02-02 DIAGNOSIS — R293 Abnormal posture: Secondary | ICD-10-CM

## 2019-02-02 NOTE — Therapy (Signed)
[facility] Physical Therapy [address] [place], [place], [address] Phone: [phone]   Fax:  [phone]  Physical Therapy Treatment  Patient Details  Name: Louis Mccullough MRN: [id number] Date of Birth: 1955/05/17 Referring Provider (PT): Cherylann Ratel, [place]   Encounter Date: 02/02/2019  PT End of Session - 02/02/19 0905    Visit Number  2    Number of Visits  30    Date for PT Re-Evaluation  04/22/19    PT Start Time  0833    PT Stop Time  0913    PT Time Calculation (min)  40 min    Activity Tolerance  Patient tolerated treatment well    Behavior During Therapy  [facility] for tasks assessed/performed       Past Medical History:  Diagnosis Date  . GERD (gastroesophageal reflux disease)    states he had previous EGD  . Gout   . History of bronchitis   . HTN (hypertension)   . Hx of adenomatous colonic polyps   . Hyperlipidemia   . Hypertriglyceridemia   . Hypothyroidism   . Seasonal allergies   . Shoulder impingement, left   . Wears glasses     Past Surgical History:  Procedure Laterality Date  . BILATERAL CARPAL TUNNEL RELEASE Bilateral 02/12/2014   Procedure: BILATERAL CARPAL TUNNEL RELEASE;  Surgeon: Daryll Brod, MD;  Location: [place];  Service: Orthopedics;  Laterality: Bilateral;  . CHOLECYSTECTOMY    . COLONOSCOPY  2006   adenomatous polyp, diverticulosis  . COLONOSCOPY  09/29/2010   Procedure: COLONOSCOPY;  Surgeon: Daneil Dolin, MD;  [facility];  Service: Endoscopy;  Laterality: N/A;  . COLONOSCOPY WITH PROPOFOL N/A 10/24/2017   Procedure: COLONOSCOPY WITH PROPOFOL;  Surgeon: Daneil Dolin, MD;  [facility];  Service: Endoscopy;  Laterality: N/A;  11:00am  . NECK SURGERY  2001   cerv fusion  . POLYPECTOMY  10/24/2017   Procedure: POLYPECTOMY;  Surgeon: Daneil Dolin, MD;  [facility];  Service: Endoscopy;;  splenic flexure and hepatic flexure, descending colon  . ROTATOR CUFF  REPAIR  2006   righrt  . SHOULDER ARTHROSCOPY WITH SUBACROMIAL DECOMPRESSION, ROTATOR CUFF REPAIR AND BICEP TENDON REPAIR Left 01/20/2019   Procedure: left shoulder arthroscopy, subacromial decompression, distal clavicle resection, mini open rotator cuff repair;  Surgeon: Garald Balding, MD;  Location: [place];  Service: Orthopedics;  Laterality: Left;  . TONSILLECTOMY      There were no vitals filed for this visit.  Subjective Assessment - 02/02/19 0836    Subjective  doing well; no pain at rest.  doing pendulums at home and letting arm rest with straight elbow.    Pertinent History  Rt RTC repair    Limitations  Lifting    Patient Stated Goals  improve function and strength    Currently in Pain?  No/denies    Pain Onset  1 to 4 weeks ago                       [facility] Adult PT Treatment/Exercise - 02/02/19 0837      Shoulder Exercises: Supine   Other Supine Exercises  scapular retraction 20x 5 sec hold      Shoulder Exercises: Seated   Other Seated Exercises  grip and AROM Lt wrist x20 reps each      Shoulder Exercises: ROM/Strengthening   Pendulum  Lt shoulder; lateral and circles x  10 each direction      Vasopneumatic   Number Minutes Vasopneumatic   10 minutes    Vasopnuematic Location   Shoulder    Vasopneumatic Pressure  Low    Vasopneumatic Temperature   34 deg      Manual Therapy   Manual Therapy  Passive ROM    Passive ROM  gentle Lt shoulder flexion/abduction to tolerance               PT Short Term Goals - 01/28/19 1300      PT SHORT TERM GOAL #1   Title  independent with initial HEP    Status  New    Target Date  03/11/19      PT SHORT TERM GOAL #2   Title  demonstrate improved PROM Lt shoulder by at least 20 degrees flexion, abduction, external rotation for improved function    Status  New    Target Date  03/11/19        PT Long Term Goals - 01/28/19 1301      PT LONG TERM GOAL #1   Title  independent  with advanced HEP    Status  New    Target Date  04/22/19      PT LONG TERM GOAL #2   Title  demonstrate Lt shoulder AROM at least 75% of Rt shoulder for improved function    Status  New    Target Date  04/22/19      PT LONG TERM GOAL #3   Title  report pain < 4/10 with activity for improved function    Status  New    Target Date  04/22/19      PT LONG TERM GOAL #4   Title  strengthening program initated and progressed in order to progress strength    Status  New    Target Date  04/22/19            Plan - 02/02/19 0906    Clinical Impression Statement  Pt tolerated session well today with minimal pain with PROM.  Overall progressing well within protocol limitations.  No goals met as only 2nd visit.    Personal Factors and Comorbidities  Past/Current Experience;Other;Comorbidity 1   severity of injury/repair   Comorbidities  HTN    Examination-Activity Limitations  Bathing;Bed Mobility;Toileting;Reach Overhead;Self Feeding;Carry;Sleep;Hygiene/Grooming;Dressing    Examination-Participation Restrictions  Other;Driving   occupation   Stability/Clinical Decision Making  Evolving/Moderate complexity    Rehab Potential  Good    PT Frequency  3x / week    PT Duration  12 weeks   3x/wk x 4-6 wks; then 2x/wk   PT Treatment/Interventions  ADLs/Self Care Home Management;Cryotherapy;Electrical Stimulation;Ultrasound;Moist Heat;Therapeutic exercise;Patient/family education;Manual techniques;Scar mobilization;Vasopneumatic Device;Taping;Passive range of motion;Dry needling    PT Next Visit Plan  PROM to tolerance; modalities PRN for pain, wrist/hand AROM    PT Home Exercise Plan  Access Code: [place]       Patient will benefit from skilled therapeutic intervention in order to improve the following deficits and impairments:  Pain, Postural dysfunction, Increased edema, Impaired UE functional use, Decreased strength, Decreased range of motion  Visit Diagnosis: Acute pain of left  shoulder  Stiffness of left shoulder, not elsewhere classified  Abnormal posture  Localized edema     Problem List Patient Active Problem List   Diagnosis Date Noted  . Status post left rotator cuff repair 01/27/2019  . Pain in left shoulder 11/28/2018  . Encounter for screening colonoscopy 08/14/2017  .  Alcohol abuse, daily use 08/14/2017  . GERD (gastroesophageal reflux disease) 08/29/2010  . Hx of adenomatous colonic polyps 08/29/2010  . FHx: colon cancer 08/29/2010      Laureen Abrahams, PT, DPT 02/02/19 9:08 AM     [facility] Physical Therapy [address] [place], [place], [address] Phone: [phone]   Fax:  [phone]  Name: Louis Mccullough MRN: [id number] Date of Birth: [birth year]

## 2019-02-03 ENCOUNTER — Ambulatory Visit (INDEPENDENT_AMBULATORY_CARE_PROVIDER_SITE_OTHER): Payer: No Typology Code available for payment source | Admitting: Physical Therapy

## 2019-02-03 ENCOUNTER — Encounter: Payer: Self-pay | Admitting: Physical Therapy

## 2019-02-03 DIAGNOSIS — R293 Abnormal posture: Secondary | ICD-10-CM | POA: Diagnosis not present

## 2019-02-03 DIAGNOSIS — M25612 Stiffness of left shoulder, not elsewhere classified: Secondary | ICD-10-CM

## 2019-02-03 DIAGNOSIS — M25512 Pain in left shoulder: Secondary | ICD-10-CM

## 2019-02-03 DIAGNOSIS — R600 Localized edema: Secondary | ICD-10-CM | POA: Diagnosis not present

## 2019-02-03 NOTE — Therapy (Signed)
[facility] Physical Therapy [address] [place], [place], [address] Phone: [phone]   Fax:  [phone]  Physical Therapy Treatment  Patient Details  Name: Louis Mccullough MRN: [id number] Date of Birth: 10/12/1955 Referring Provider (PT): Cherylann Ratel, [place]   Encounter Date: 02/03/2019  PT End of Session - 02/03/19 0836    Visit Number  3    Number of Visits  30    Date for PT Re-Evaluation  04/22/19    PT Start Time  0802    PT Stop Time  0845    PT Time Calculation (min)  43 min    Activity Tolerance  Patient tolerated treatment well    Behavior During Therapy  [facility] for tasks assessed/performed       Past Medical History:  Diagnosis Date  . GERD (gastroesophageal reflux disease)    states he had previous EGD  . Gout   . History of bronchitis   . HTN (hypertension)   . Hx of adenomatous colonic polyps   . Hyperlipidemia   . Hypertriglyceridemia   . Hypothyroidism   . Seasonal allergies   . Shoulder impingement, left   . Wears glasses     Past Surgical History:  Procedure Laterality Date  . BILATERAL CARPAL TUNNEL RELEASE Bilateral 02/12/2014   Procedure: BILATERAL CARPAL TUNNEL RELEASE;  Surgeon: Daryll Brod, MD;  Location: [place];  Service: Orthopedics;  Laterality: Bilateral;  . CHOLECYSTECTOMY    . COLONOSCOPY  2006   adenomatous polyp, diverticulosis  . COLONOSCOPY  09/29/2010   Procedure: COLONOSCOPY;  Surgeon: Daneil Dolin, MD;  [facility];  Service: Endoscopy;  Laterality: N/A;  . COLONOSCOPY WITH PROPOFOL N/A 10/24/2017   Procedure: COLONOSCOPY WITH PROPOFOL;  Surgeon: Daneil Dolin, MD;  [facility];  Service: Endoscopy;  Laterality: N/A;  11:00am  . NECK SURGERY  2001   cerv fusion  . POLYPECTOMY  10/24/2017   Procedure: POLYPECTOMY;  Surgeon: Daneil Dolin, MD;  [facility];  Service: Endoscopy;;  splenic flexure and hepatic flexure, descending colon  . ROTATOR CUFF  REPAIR  2006   righrt  . SHOULDER ARTHROSCOPY WITH SUBACROMIAL DECOMPRESSION, ROTATOR CUFF REPAIR AND BICEP TENDON REPAIR Left 01/20/2019   Procedure: left shoulder arthroscopy, subacromial decompression, distal clavicle resection, mini open rotator cuff repair;  Surgeon: Garald Balding, MD;  Location: [place];  Service: Orthopedics;  Laterality: Left;  . TONSILLECTOMY      There were no vitals filed for this visit.  Subjective Assessment - 02/03/19 0805    Subjective  no changes since yesterday.  shoulder still doing well    Pertinent History  Rt RTC repair    Limitations  Lifting    Patient Stated Goals  improve function and strength    Currently in Pain?  No/denies    Pain Onset  1 to 4 weeks ago                       [facility] Adult PT Treatment/Exercise - 02/03/19 0820      Shoulder Exercises: Supine   Other Supine Exercises  scapular retraction 20x 5 sec hold      Shoulder Exercises: Seated   Other Seated Exercises  shoulder rolls backwards x 20      Vasopneumatic   Number Minutes Vasopneumatic   10 minutes    Vasopnuematic Location   Shoulder    Vasopneumatic Pressure  Low  Vasopneumatic Temperature   34 deg      Manual Therapy   Passive ROM  gentle Lt shoulder flexion/abduction/er to tolerance               PT Short Term Goals - 01/28/19 1300      PT SHORT TERM GOAL #1   Title  independent with initial HEP    Status  New    Target Date  03/11/19      PT SHORT TERM GOAL #2   Title  demonstrate improved PROM Lt shoulder by at least 20 degrees flexion, abduction, external rotation for improved function    Status  New    Target Date  03/11/19        PT Long Term Goals - 01/28/19 1301      PT LONG TERM GOAL #1   Title  independent with advanced HEP    Status  New    Target Date  04/22/19      PT LONG TERM GOAL #2   Title  demonstrate Lt shoulder AROM at least 75% of Rt shoulder for improved function    Status   New    Target Date  04/22/19      PT LONG TERM GOAL #3   Title  report pain < 4/10 with activity for improved function    Status  New    Target Date  04/22/19      PT LONG TERM GOAL #4   Title  strengthening program initated and progressed in order to progress strength    Status  New    Target Date  04/22/19            Plan - 02/03/19 0837    Clinical Impression Statement  Pt continues to tolerate PROM within pain free range well and overall no pain currently.  Progressing well, and will continue to benefit from PT to maximize function.    Personal Factors and Comorbidities  Past/Current Experience;Other;Comorbidity 1   severity of injury/repair   Comorbidities  HTN    Examination-Activity Limitations  Bathing;Bed Mobility;Toileting;Reach Overhead;Self Feeding;Carry;Sleep;Hygiene/Grooming;Dressing    Examination-Participation Restrictions  Other;Driving   occupation   Stability/Clinical Decision Making  Evolving/Moderate complexity    Rehab Potential  Good    PT Frequency  3x / week    PT Duration  12 weeks   3x/wk x 4-6 wks; then 2x/wk   PT Treatment/Interventions  ADLs/Self Care Home Management;Cryotherapy;Electrical Stimulation;Ultrasound;Moist Heat;Therapeutic exercise;Patient/family education;Manual techniques;Scar mobilization;Vasopneumatic Device;Taping;Passive range of motion;Dry needling    PT Next Visit Plan  PROM to tolerance; modalities PRN for pain, wrist/hand AROM; measure PROM    PT Home Exercise Plan  Access Code: [place] and Agree with Plan of Care  Patient       Patient will benefit from skilled therapeutic intervention in order to improve the following deficits and impairments:  Pain, Postural dysfunction, Increased edema, Impaired UE functional use, Decreased strength, Decreased range of motion  Visit Diagnosis: Acute pain of left shoulder  Stiffness of left shoulder, not elsewhere classified  Abnormal posture  Localized  edema     Problem List Patient Active Problem List   Diagnosis Date Noted  . Status post left rotator cuff repair 01/27/2019  . Pain in left shoulder 11/28/2018  . Encounter for screening colonoscopy 08/14/2017  . Alcohol abuse, daily use 08/14/2017  . GERD (gastroesophageal reflux disease) 08/29/2010  . Hx of adenomatous colonic polyps 08/29/2010  . FHx: colon cancer 08/29/2010  Laureen Abrahams, PT, DPT 02/03/19 8:38 AM     [facility] Physical Therapy [address] [place], [place], [address] Phone: [phone]   Fax:  [phone]  Name: KIRKLYN CUOCO MRN: [id number] Date of Birth: [birth year]

## 2019-02-04 ENCOUNTER — Other Ambulatory Visit: Payer: Self-pay

## 2019-02-04 ENCOUNTER — Ambulatory Visit: Payer: No Typology Code available for payment source | Admitting: Physical Therapy

## 2019-02-04 DIAGNOSIS — R600 Localized edema: Secondary | ICD-10-CM

## 2019-02-04 DIAGNOSIS — R293 Abnormal posture: Secondary | ICD-10-CM | POA: Diagnosis not present

## 2019-02-04 DIAGNOSIS — M25512 Pain in left shoulder: Secondary | ICD-10-CM | POA: Diagnosis not present

## 2019-02-04 DIAGNOSIS — M25612 Stiffness of left shoulder, not elsewhere classified: Secondary | ICD-10-CM | POA: Diagnosis not present

## 2019-02-04 NOTE — Therapy (Signed)
[facility] [address] [place], [place], [address] Phone: [phone]   Fax:  [phone]  Physical Therapy Treatment  Patient Details  Name: Louis Mccullough MRN: [id number] Date of Birth: 1955-08-05 Referring Provider (PT): Cherylann Ratel, [place]   Encounter Date: 02/04/2019  PT End of Session - 02/04/19 1140    Visit Number  4    Number of Visits  30    Date for PT Re-Evaluation  04/22/19    PT Start Time  1100    PT Stop Time  1143    PT Time Calculation (min)  43 min    Activity Tolerance  Patient tolerated treatment well    Behavior During Therapy  [facility] for tasks assessed/performed       Past Medical History:  Diagnosis Date  . GERD (gastroesophageal reflux disease)    states he had previous EGD  . Gout   . History of bronchitis   . HTN (hypertension)   . Hx of adenomatous colonic polyps   . Hyperlipidemia   . Hypertriglyceridemia   . Hypothyroidism   . Seasonal allergies   . Shoulder impingement, left   . Wears glasses     Past Surgical History:  Procedure Laterality Date  . BILATERAL CARPAL TUNNEL RELEASE Bilateral 02/12/2014   Procedure: BILATERAL CARPAL TUNNEL RELEASE;  Surgeon: Daryll Brod, MD;  Location: [place];  Service: Orthopedics;  Laterality: Bilateral;  . CHOLECYSTECTOMY    . COLONOSCOPY  2006   adenomatous polyp, diverticulosis  . COLONOSCOPY  09/29/2010   Procedure: COLONOSCOPY;  Surgeon: Daneil Dolin, MD;  [facility];  Service: Endoscopy;  Laterality: N/A;  . COLONOSCOPY WITH PROPOFOL N/A 10/24/2017   Procedure: COLONOSCOPY WITH PROPOFOL;  Surgeon: Daneil Dolin, MD;  [facility];  Service: Endoscopy;  Laterality: N/A;  11:00am  . NECK SURGERY  2001   cerv fusion  . POLYPECTOMY  10/24/2017   Procedure: POLYPECTOMY;  Surgeon: Daneil Dolin, MD;  [facility];  Service: Endoscopy;;  splenic flexure and hepatic flexure, descending colon  . ROTATOR CUFF  REPAIR  2006   righrt  . SHOULDER ARTHROSCOPY WITH SUBACROMIAL DECOMPRESSION, ROTATOR CUFF REPAIR AND BICEP TENDON REPAIR Left 01/20/2019   Procedure: left shoulder arthroscopy, subacromial decompression, distal clavicle resection, mini open rotator cuff repair;  Surgeon: Garald Balding, MD;  Location: [place];  Service: Orthopedics;  Laterality: Left;  . TONSILLECTOMY      There were no vitals filed for this visit.  Subjective Assessment - 02/04/19 1106    Subjective  no pain upon arrival.  shoulder still doing well    Pertinent History  Rt RTC repair    Limitations  Lifting    Patient Stated Goals  improve function and strength    Currently in Pain?  No/denies    Pain Onset  1 to 4 weeks ago         [facility] PT Assessment - 02/04/19 0001      Assessment   Medical Diagnosis  Lt RTC repair    Referring Provider (PT)  Cherylann Ratel, PA-C    Onset Date/Surgical Date  01/20/19    Next MD Visit  02/17/19      PROM   Left Shoulder Flexion  80 Degrees    Left Shoulder ABduction  77 Degrees    Left Shoulder Internal Rotation  60 Degrees   in 45 deg abd   Left Shoulder  External Rotation  35 Degrees                   [facility] Adult PT Treatment/Exercise - 02/04/19 0001      Shoulder Exercises: Supine   Other Supine Exercises  scapular retraction 20x 5 sec hold      Shoulder Exercises: Seated   Other Seated Exercises  shoulder rolls backwards x 20    Other Seated Exercises  elbow flexion gentle 2X10, wrist flexion, extension, pronation, supinaton all AROM 2X10 reps, gripping towel X 20 reps      Shoulder Exercises: Standing   Other Standing Exercises  pendulums X 20 ea plane      Vasopneumatic   Number Minutes Vasopneumatic   10 minutes    Vasopnuematic Location   Shoulder    Vasopneumatic Pressure  Low    Vasopneumatic Temperature   34 deg      Manual Therapy   Passive ROM  gentle Lt shoulder flexion/abduction/er/IR to tolerance                PT Short Term Goals - 01/28/19 1300      PT SHORT TERM GOAL #1   Title  independent with initial HEP    Status  New    Target Date  03/11/19      PT SHORT TERM GOAL #2   Title  demonstrate improved PROM Lt shoulder by at least 20 degrees flexion, abduction, external rotation for improved function    Status  New    Target Date  03/11/19        PT Long Term Goals - 01/28/19 1301      PT LONG TERM GOAL #1   Title  independent with advanced HEP    Status  New    Target Date  04/22/19      PT LONG TERM GOAL #2   Title  demonstrate Lt shoulder AROM at least 75% of Rt shoulder for improved function    Status  New    Target Date  04/22/19      PT LONG TERM GOAL #3   Title  report pain < 4/10 with activity for improved function    Status  New    Target Date  04/22/19      PT LONG TERM GOAL #4   Title  strengthening program initated and progressed in order to progress strength    Status  New    Target Date  04/22/19            Plan - 02/04/19 1140    Clinical Impression Statement  Pt improving with PROM, see updated measurements. Added elbow/Wrist AROM, and gripping today with good tolerance. Continue POC per protocol.    Personal Factors and Comorbidities  Past/Current Experience;Other;Comorbidity 1   severity of injury/repair   Comorbidities  HTN    Examination-Activity Limitations  Bathing;Bed Mobility;Toileting;Reach Overhead;Self Feeding;Carry;Sleep;Hygiene/Grooming;Dressing    Examination-Participation Restrictions  Other;Driving   occupation   Stability/Clinical Decision Making  Evolving/Moderate complexity    Rehab Potential  Good    PT Frequency  3x / week    PT Duration  12 weeks   3x/wk x 4-6 wks; then 2x/wk   PT Treatment/Interventions  ADLs/Self Care Home Management;Cryotherapy;Electrical Stimulation;Ultrasound;Moist Heat;Therapeutic exercise;Patient/family education;Manual techniques;Scar mobilization;Vasopneumatic  Device;Taping;Passive range of motion;Dry needling    PT Next Visit Plan  PROM to tolerance; modalities PRN for pain, wrist/hand AROM; measure PROM    PT Home Exercise Plan  Access Code: [facility]  Consulted and Agree with Plan of Care  Patient       Patient will benefit from skilled therapeutic intervention in order to improve the following deficits and impairments:  Pain, Postural dysfunction, Increased edema, Impaired UE functional use, Decreased strength, Decreased range of motion  Visit Diagnosis: Acute pain of left shoulder  Stiffness of left shoulder, not elsewhere classified  Abnormal posture  Localized edema     Problem List Patient Active Problem List   Diagnosis Date Noted  . Status post left rotator cuff repair 01/27/2019  . Pain in left shoulder 11/28/2018  . Encounter for screening colonoscopy 08/14/2017  . Alcohol abuse, daily use 08/14/2017  . GERD (gastroesophageal reflux disease) 08/29/2010  . Hx of adenomatous colonic polyps 08/29/2010  . FHx: colon cancer 08/29/2010    Silvestre Mesi 02/04/2019, 11:51 AM  [facility] [address] [place], [place], [address] Phone: [phone]   Fax:  [phone]  Name: SKIPPER MACHIN MRN: [id number] Date of Birth: 1955/08/05

## 2019-02-10 ENCOUNTER — Other Ambulatory Visit: Payer: Self-pay

## 2019-02-10 ENCOUNTER — Encounter: Payer: Self-pay | Admitting: Physical Therapy

## 2019-02-10 ENCOUNTER — Ambulatory Visit (INDEPENDENT_AMBULATORY_CARE_PROVIDER_SITE_OTHER): Payer: No Typology Code available for payment source | Admitting: Physical Therapy

## 2019-02-10 DIAGNOSIS — M25612 Stiffness of left shoulder, not elsewhere classified: Secondary | ICD-10-CM | POA: Diagnosis not present

## 2019-02-10 DIAGNOSIS — R600 Localized edema: Secondary | ICD-10-CM

## 2019-02-10 DIAGNOSIS — R293 Abnormal posture: Secondary | ICD-10-CM

## 2019-02-10 DIAGNOSIS — M25512 Pain in left shoulder: Secondary | ICD-10-CM

## 2019-02-10 NOTE — Therapy (Signed)
[facility] [address] [place], [place], [address] Phone: [phone]   Fax:  [phone]  Physical Therapy Treatment  Patient Details  Name: Louis Mccullough MRN: [id number] Date of Birth: 1956/03/10 Referring Provider (PT): Cherylann Ratel, [place]   Encounter Date: 02/10/2019  PT End of Session - 02/10/19 1347    Visit Number  5    Number of Visits  30    Date for PT Re-Evaluation  04/22/19    PT Start Time  [id number]    PT Stop Time  1558    PT Time Calculation (min)  42 min    Activity Tolerance  Patient tolerated treatment well    Behavior During Therapy  [facility] for tasks assessed/performed       Past Medical History:  Diagnosis Date  . GERD (gastroesophageal reflux disease)    states he had previous EGD  . Gout   . History of bronchitis   . HTN (hypertension)   . Hx of adenomatous colonic polyps   . Hyperlipidemia   . Hypertriglyceridemia   . Hypothyroidism   . Seasonal allergies   . Shoulder impingement, left   . Wears glasses     Past Surgical History:  Procedure Laterality Date  . BILATERAL CARPAL TUNNEL RELEASE Bilateral 02/12/2014   Procedure: BILATERAL CARPAL TUNNEL RELEASE;  Surgeon: Daryll Brod, MD;  Location: [place];  Service: Orthopedics;  Laterality: Bilateral;  . CHOLECYSTECTOMY    . COLONOSCOPY  2006   adenomatous polyp, diverticulosis  . COLONOSCOPY  09/29/2010   Procedure: COLONOSCOPY;  Surgeon: Daneil Dolin, MD;  [facility];  Service: Endoscopy;  Laterality: N/A;  . COLONOSCOPY WITH PROPOFOL N/A 10/24/2017   Procedure: COLONOSCOPY WITH PROPOFOL;  Surgeon: Daneil Dolin, MD;  [facility];  Service: Endoscopy;  Laterality: N/A;  11:00am  . NECK SURGERY  2001   cerv fusion  . POLYPECTOMY  10/24/2017   Procedure: POLYPECTOMY;  Surgeon: Daneil Dolin, MD;  [facility];  Service: Endoscopy;;  splenic flexure and hepatic flexure, descending colon  . ROTATOR CUFF  REPAIR  2006   righrt  . SHOULDER ARTHROSCOPY WITH SUBACROMIAL DECOMPRESSION, ROTATOR CUFF REPAIR AND BICEP TENDON REPAIR Left 01/20/2019   Procedure: left shoulder arthroscopy, subacromial decompression, distal clavicle resection, mini open rotator cuff repair;  Surgeon: Garald Balding, MD;  Location: [place];  Service: Orthopedics;  Laterality: Left;  . TONSILLECTOMY      There were no vitals filed for this visit.  Subjective Assessment - 02/10/19 1319    Subjective  doing well; no pain.  shoulder is the same currently    Pertinent History  Rt RTC repair    Limitations  Lifting    Patient Stated Goals  improve function and strength    Currently in Pain?  No/denies    Pain Onset  1 to 4 weeks ago                       [facility] Adult PT Treatment/Exercise - 02/10/19 1320      Shoulder Exercises: Seated   Other Seated Exercises  elbow flexion gentle 2X10, wrist flexion, extension, pronation, supinaton all AROM 2X10 reps, gripping towel X 20 reps      Shoulder Exercises: ROM/Strengthening   Pendulum  Lt shoulder; lateral and circles x 10 each direction      Vasopneumatic   Number Minutes Vasopneumatic   10 minutes  Vasopnuematic Location   Shoulder    Vasopneumatic Pressure  Low    Vasopneumatic Temperature   34 deg      Manual Therapy   Passive ROM  gentle Lt shoulder flexion/abduction/er/IR to tolerance               PT Short Term Goals - 01/28/19 1300      PT SHORT TERM GOAL #1   Title  independent with initial HEP    Status  New    Target Date  03/11/19      PT SHORT TERM GOAL #2   Title  demonstrate improved PROM Lt shoulder by at least 20 degrees flexion, abduction, external rotation for improved function    Status  New    Target Date  03/11/19        PT Long Term Goals - 01/28/19 1301      PT LONG TERM GOAL #1   Title  independent with advanced HEP    Status  New    Target Date  04/22/19      PT LONG TERM  GOAL #2   Title  demonstrate Lt shoulder AROM at least 75% of Rt shoulder for improved function    Status  New    Target Date  04/22/19      PT LONG TERM GOAL #3   Title  report pain < 4/10 with activity for improved function    Status  New    Target Date  04/22/19      PT LONG TERM GOAL #4   Title  strengthening program initated and progressed in order to progress strength    Status  New    Target Date  04/22/19            Plan - 02/10/19 1348    Clinical Impression Statement  Pt continues to improve with PROM with minimal pain.  Overall progressing well with PT.    Personal Factors and Comorbidities  Past/Current Experience;Other;Comorbidity 1   severity of injury/repair   Comorbidities  HTN    Examination-Activity Limitations  Bathing;Bed Mobility;Toileting;Reach Overhead;Self Feeding;Carry;Sleep;Hygiene/Grooming;Dressing    Examination-Participation Restrictions  Other;Driving   occupation   Stability/Clinical Decision Making  Evolving/Moderate complexity    Rehab Potential  Good    PT Frequency  3x / week    PT Duration  12 weeks   3x/wk x 4-6 wks; then 2x/wk   PT Treatment/Interventions  ADLs/Self Care Home Management;Cryotherapy;Electrical Stimulation;Ultrasound;Moist Heat;Therapeutic exercise;Patient/family education;Manual techniques;Scar mobilization;Vasopneumatic Device;Taping;Passive range of motion;Dry needling    PT Next Visit Plan  PROM to tolerance; modalities PRN for pain, wrist/hand AROM    PT Home Exercise Plan  Access Code: [place] and Agree with Plan of Care  Patient       Patient will benefit from skilled therapeutic intervention in order to improve the following deficits and impairments:  Pain, Postural dysfunction, Increased edema, Impaired UE functional use, Decreased strength, Decreased range of motion  Visit Diagnosis: Acute pain of left shoulder  Stiffness of left shoulder, not elsewhere classified  Abnormal  posture  Localized edema     Problem List Patient Active Problem List   Diagnosis Date Noted  . Status post left rotator cuff repair 01/27/2019  . Pain in left shoulder 11/28/2018  . Encounter for screening colonoscopy 08/14/2017  . Alcohol abuse, daily use 08/14/2017  . GERD (gastroesophageal reflux disease) 08/29/2010  . Hx of adenomatous colonic polyps 08/29/2010  . FHx: colon cancer 08/29/2010  Laureen Abrahams, PT, DPT 02/10/19 1:49 PM     [facility] [address] [place], [place], [address] Phone: [phone]   Fax:  [phone]  Name: Louis Mccullough MRN: [id number] Date of Birth: June 16, 1955

## 2019-02-11 ENCOUNTER — Ambulatory Visit: Payer: No Typology Code available for payment source | Admitting: Physical Therapy

## 2019-02-11 ENCOUNTER — Encounter: Payer: No Typology Code available for payment source | Admitting: Physical Therapy

## 2019-02-12 ENCOUNTER — Ambulatory Visit: Payer: No Typology Code available for payment source | Admitting: Surgery

## 2019-02-13 ENCOUNTER — Other Ambulatory Visit: Payer: Self-pay

## 2019-02-13 ENCOUNTER — Ambulatory Visit: Payer: No Typology Code available for payment source | Admitting: Physical Therapy

## 2019-02-13 ENCOUNTER — Encounter: Payer: Self-pay | Admitting: Physical Therapy

## 2019-02-13 DIAGNOSIS — M25612 Stiffness of left shoulder, not elsewhere classified: Secondary | ICD-10-CM | POA: Diagnosis not present

## 2019-02-13 DIAGNOSIS — M25512 Pain in left shoulder: Secondary | ICD-10-CM | POA: Diagnosis not present

## 2019-02-13 DIAGNOSIS — R293 Abnormal posture: Secondary | ICD-10-CM

## 2019-02-13 DIAGNOSIS — R600 Localized edema: Secondary | ICD-10-CM

## 2019-02-13 NOTE — Therapy (Signed)
[facility] [address] [place], [place], [address] Phone: [phone]   Fax:  [phone]  Physical Therapy Treatment  Patient Details  Name: Louis Mccullough MRN: [id number] Date of Birth: 1955-10-26 Referring Provider (PT): Cherylann Ratel, [place]   Encounter Date: 02/13/2019  PT End of Session - 02/13/19 1057    Visit Number  6    Number of Visits  30    Date for PT Re-Evaluation  04/22/19    PT Start Time  [id number]    PT Stop Time  1054    PT Time Calculation (min)  39 min    Activity Tolerance  Patient tolerated treatment well    Behavior During Therapy  [facility] for tasks assessed/performed       Past Medical History:  Diagnosis Date  . GERD (gastroesophageal reflux disease)    states he had previous EGD  . Gout   . History of bronchitis   . HTN (hypertension)   . Hx of adenomatous colonic polyps   . Hyperlipidemia   . Hypertriglyceridemia   . Hypothyroidism   . Seasonal allergies   . Shoulder impingement, left   . Wears glasses     Past Surgical History:  Procedure Laterality Date  . BILATERAL CARPAL TUNNEL RELEASE Bilateral 02/12/2014   Procedure: BILATERAL CARPAL TUNNEL RELEASE;  Surgeon: Daryll Brod, MD;  Location: [place];  Service: Orthopedics;  Laterality: Bilateral;  . CHOLECYSTECTOMY    . COLONOSCOPY  2006   adenomatous polyp, diverticulosis  . COLONOSCOPY  09/29/2010   Procedure: COLONOSCOPY;  Surgeon: Daneil Dolin, MD;  [facility];  Service: Endoscopy;  Laterality: N/A;  . COLONOSCOPY WITH PROPOFOL N/A 10/24/2017   Procedure: COLONOSCOPY WITH PROPOFOL;  Surgeon: Daneil Dolin, MD;  [facility];  Service: Endoscopy;  Laterality: N/A;  11:00am  . NECK SURGERY  2001   cerv fusion  . POLYPECTOMY  10/24/2017   Procedure: POLYPECTOMY;  Surgeon: Daneil Dolin, MD;  [facility];  Service: Endoscopy;;  splenic flexure and hepatic flexure, descending colon  . ROTATOR CUFF  REPAIR  2006   righrt  . SHOULDER ARTHROSCOPY WITH SUBACROMIAL DECOMPRESSION, ROTATOR CUFF REPAIR AND BICEP TENDON REPAIR Left 01/20/2019   Procedure: left shoulder arthroscopy, subacromial decompression, distal clavicle resection, mini open rotator cuff repair;  Surgeon: Garald Balding, MD;  Location: [place];  Service: Orthopedics;  Laterality: Left;  . TONSILLECTOMY      There were no vitals filed for this visit.  Subjective Assessment - 02/13/19 1018    Subjective  it gets better every day.    Pertinent History  Rt RTC repair    Limitations  Lifting    Patient Stated Goals  improve function and strength    Pain Score  0-No pain    Pain Onset  1 to 4 weeks ago                       [facility] Adult PT Treatment/Exercise - 02/13/19 1032      Elbow Exercises   Elbow Flexion  20 reps;Supine;Left      Shoulder Exercises: Supine   Other Supine Exercises  scapular retraction 20x 5 sec hold      Shoulder Exercises: Seated   Other Seated Exercises  shoulder rolls/scapular retraction x 15 reps each    Other Seated Exercises  scapular depression into red physioball 20x5 sec  Vasopneumatic   Number Minutes Vasopneumatic   --   pt declined     Manual Therapy   Manual Therapy  Passive ROM;Soft tissue mobilization    Soft tissue mobilization  IASTM to post shoulder/infraspinatus/teres minor/UT/rhomboids    Passive ROM  gentle Lt shoulder flexion/abduction/er/IR to tolerance               PT Short Term Goals - 01/28/19 1300      PT SHORT TERM GOAL #1   Title  independent with initial HEP    Status  New    Target Date  03/11/19      PT SHORT TERM GOAL #2   Title  demonstrate improved PROM Lt shoulder by at least 20 degrees flexion, abduction, external rotation for improved function    Status  New    Target Date  03/11/19        PT Long Term Goals - 01/28/19 1301      PT LONG TERM GOAL #1   Title  independent with advanced HEP     Status  New    Target Date  04/22/19      PT LONG TERM GOAL #2   Title  demonstrate Lt shoulder AROM at least 75% of Rt shoulder for improved function    Status  New    Target Date  04/22/19      PT LONG TERM GOAL #3   Title  report pain < 4/10 with activity for improved function    Status  New    Target Date  04/22/19      PT LONG TERM GOAL #4   Title  strengthening program initated and progressed in order to progress strength    Status  New    Target Date  04/22/19            Plan - 02/13/19 1058    Clinical Impression Statement  Pt continues to progress as expected s/p RTC repair.  Minimal pain and improving ROM.  Will continue to benefit from PT to maximize function.    Personal Factors and Comorbidities  Past/Current Experience;Other;Comorbidity 1   severity of injury/repair   Comorbidities  HTN    Examination-Activity Limitations  Bathing;Bed Mobility;Toileting;Reach Overhead;Self Feeding;Carry;Sleep;Hygiene/Grooming;Dressing    Examination-Participation Restrictions  Other;Driving   occupation   Stability/Clinical Decision Making  Evolving/Moderate complexity    Rehab Potential  Good    PT Frequency  3x / week    PT Duration  12 weeks   3x/wk x 4-6 wks; then 2x/wk   PT Treatment/Interventions  ADLs/Self Care Home Management;Cryotherapy;Electrical Stimulation;Ultrasound;Moist Heat;Therapeutic exercise;Patient/family education;Manual techniques;Scar mobilization;Vasopneumatic Device;Taping;Passive range of motion;Dry needling    PT Next Visit Plan  PROM to tolerance; modalities PRN for pain, wrist/hand AROM    PT Home Exercise Plan  Access Code: [place] and Agree with Plan of Care  Patient       Patient will benefit from skilled therapeutic intervention in order to improve the following deficits and impairments:  Pain, Postural dysfunction, Increased edema, Impaired UE functional use, Decreased strength, Decreased range of motion  Visit  Diagnosis: Acute pain of left shoulder  Stiffness of left shoulder, not elsewhere classified  Abnormal posture  Localized edema     Problem List Patient Active Problem List   Diagnosis Date Noted  . Status post left rotator cuff repair 01/27/2019  . Pain in left shoulder 11/28/2018  . Encounter for screening colonoscopy 08/14/2017  . Alcohol abuse, daily use  08/14/2017  . GERD (gastroesophageal reflux disease) 08/29/2010  . Hx of adenomatous colonic polyps 08/29/2010  . FHx: colon cancer 08/29/2010      Laureen Abrahams, PT, DPT 02/13/19 10:59 AM     [facility] [address] [place], [place], [address] Phone: [phone]   Fax:  [phone]  Name: Louis Mccullough MRN: [id number] Date of Birth: 05-14-1955

## 2019-02-16 ENCOUNTER — Encounter: Payer: No Typology Code available for payment source | Admitting: Physical Therapy

## 2019-02-17 ENCOUNTER — Ambulatory Visit: Payer: No Typology Code available for payment source | Admitting: Orthopaedic Surgery

## 2019-02-18 ENCOUNTER — Encounter: Payer: Self-pay | Admitting: Physical Therapy

## 2019-02-18 ENCOUNTER — Other Ambulatory Visit: Payer: Self-pay

## 2019-02-18 ENCOUNTER — Ambulatory Visit (INDEPENDENT_AMBULATORY_CARE_PROVIDER_SITE_OTHER): Payer: No Typology Code available for payment source | Admitting: Physical Therapy

## 2019-02-18 DIAGNOSIS — R600 Localized edema: Secondary | ICD-10-CM | POA: Diagnosis not present

## 2019-02-18 DIAGNOSIS — M25512 Pain in left shoulder: Secondary | ICD-10-CM

## 2019-02-18 DIAGNOSIS — M25612 Stiffness of left shoulder, not elsewhere classified: Secondary | ICD-10-CM | POA: Diagnosis not present

## 2019-02-18 DIAGNOSIS — R293 Abnormal posture: Secondary | ICD-10-CM

## 2019-02-18 NOTE — Therapy (Signed)
[facility] Physical Therapy [address] [place], [place], [address] Phone: [phone]   Fax:  [phone]  Physical Therapy Treatment  Patient Details  Name: Louis Mccullough MRN: [id number] Date of Birth: 06-13-1955 Referring Provider (PT): Cherylann Ratel, [place]   Encounter Date: 02/18/2019  PT End of Session - 02/18/19 1100    Visit Number  7    Number of Visits  30    Date for PT Re-Evaluation  04/22/19    PT Start Time  1016    PT Stop Time  1057    PT Time Calculation (min)  41 min    Activity Tolerance  Patient tolerated treatment well    Behavior During Therapy  [facility] for tasks assessed/performed       Past Medical History:  Diagnosis Date  . GERD (gastroesophageal reflux disease)    states he had previous EGD  . Gout   . History of bronchitis   . HTN (hypertension)   . Hx of adenomatous colonic polyps   . Hyperlipidemia   . Hypertriglyceridemia   . Hypothyroidism   . Seasonal allergies   . Shoulder impingement, left   . Wears glasses     Past Surgical History:  Procedure Laterality Date  . BILATERAL CARPAL TUNNEL RELEASE Bilateral 02/12/2014   Procedure: BILATERAL CARPAL TUNNEL RELEASE;  Surgeon: Daryll Brod, MD;  Location: [place];  Service: Orthopedics;  Laterality: Bilateral;  . CHOLECYSTECTOMY    . COLONOSCOPY  2006   adenomatous polyp, diverticulosis  . COLONOSCOPY  09/29/2010   Procedure: COLONOSCOPY;  Surgeon: Daneil Dolin, MD;  [facility];  Service: Endoscopy;  Laterality: N/A;  . COLONOSCOPY WITH PROPOFOL N/A 10/24/2017   Procedure: COLONOSCOPY WITH PROPOFOL;  Surgeon: Daneil Dolin, MD;  [facility];  Service: Endoscopy;  Laterality: N/A;  11:00am  . NECK SURGERY  2001   cerv fusion  . POLYPECTOMY  10/24/2017   Procedure: POLYPECTOMY;  Surgeon: Daneil Dolin, MD;  [facility];  Service: Endoscopy;;  splenic flexure and hepatic flexure, descending colon  . ROTATOR CUFF  REPAIR  2006   righrt  . SHOULDER ARTHROSCOPY WITH SUBACROMIAL DECOMPRESSION, ROTATOR CUFF REPAIR AND BICEP TENDON REPAIR Left 01/20/2019   Procedure: left shoulder arthroscopy, subacromial decompression, distal clavicle resection, mini open rotator cuff repair;  Surgeon: Garald Balding, MD;  Location: [place];  Service: Orthopedics;  Laterality: Left;  . TONSILLECTOMY      There were no vitals filed for this visit.  Subjective Assessment - 02/18/19 1102    Subjective  doing well    Pertinent History  Rt RTC repair    Limitations  Lifting    Patient Stated Goals  improve function and strength    Currently in Pain?  No/denies    Pain Onset  1 to 4 weeks ago         [facility] PT Assessment - 02/18/19 1025      Assessment   Medical Diagnosis  Lt RTC repair    Referring Provider (PT)  Cherylann Ratel, PA-C    Onset Date/Surgical Date  01/20/19    Hand Dominance  Right    Next MD Visit  02/19/19      PROM   Left Shoulder Flexion  135 Degrees    Left Shoulder ABduction  111 Degrees   119 scapular plane   Left Shoulder Internal Rotation  65 Degrees    Left Shoulder External  Rotation  48 Degrees                   [facility] Adult PT Treatment/Exercise - 02/18/19 1041      Elbow Exercises   Elbow Flexion  20 reps;Left;Seated      Shoulder Exercises: Supine   Other Supine Exercises  scapular retraction 20x 5 sec hold      Shoulder Exercises: Seated   Other Seated Exercises  shoulder rolls (forward/backwards)/scapular retraction x 20 reps each    Other Seated Exercises  scapular depression into red physioball 20x5 sec      Shoulder Exercises: Therapy Ball   Flexion  Both;20 reps    Flexion Limitations  keeping Lt shoulder passive    Scaption  Left;20 reps    Scaption Limitations  keeping Lt shoulder passive      Manual Therapy   Manual Therapy  Passive ROM    Passive ROM  gentle Lt shoulder flexion/abduction/er/IR to tolerance                PT Short Term Goals - 02/18/19 1100      PT SHORT TERM GOAL #1   Title  independent with initial HEP    Status  Achieved    Target Date  03/11/19      PT SHORT TERM GOAL #2   Title  demonstrate improved PROM Lt shoulder by at least 20 degrees flexion, abduction, external rotation for improved function    Status  Achieved    Target Date  03/11/19        PT Long Term Goals - 01/28/19 1301      PT LONG TERM GOAL #1   Title  independent with advanced HEP    Status  New    Target Date  04/22/19      PT LONG TERM GOAL #2   Title  demonstrate Lt shoulder AROM at least 75% of Rt shoulder for improved function    Status  New    Target Date  04/22/19      PT LONG TERM GOAL #3   Title  report pain < 4/10 with activity for improved function    Status  New    Target Date  04/22/19      PT LONG TERM GOAL #4   Title  strengthening program initated and progressed in order to progress strength    Status  New    Target Date  04/22/19            Plan - 02/18/19 1101    Clinical Impression Statement  Pt has met both STGs and overall is doing well with PT.  PROM progressing well, anticipate he will be ready to move into next phase when appropriate.    Personal Factors and Comorbidities  Past/Current Experience;Other;Comorbidity 1   severity of injury/repair   Comorbidities  HTN    Examination-Activity Limitations  Bathing;Bed Mobility;Toileting;Reach Overhead;Self Feeding;Carry;Sleep;Hygiene/Grooming;Dressing    Examination-Participation Restrictions  Other;Driving   occupation   Stability/Clinical Decision Making  Evolving/Moderate complexity    Rehab Potential  Good    PT Frequency  3x / week    PT Duration  12 weeks   3x/wk x 4-6 wks; then 2x/wk   PT Treatment/Interventions  ADLs/Self Care Home Management;Cryotherapy;Electrical Stimulation;Ultrasound;Moist Heat;Therapeutic exercise;Patient/family education;Manual techniques;Scar mobilization;Vasopneumatic  Device;Taping;Passive range of motion;Dry needling    PT Next Visit Plan  PROM to tolerance; modalities PRN for pain, wrist/hand AROM    PT Home Exercise Plan  Access  Code: [place] and Agree with Plan of Care  Patient       Patient will benefit from skilled therapeutic intervention in order to improve the following deficits and impairments:  Pain, Postural dysfunction, Increased edema, Impaired UE functional use, Decreased strength, Decreased range of motion  Visit Diagnosis: Acute pain of left shoulder  Stiffness of left shoulder, not elsewhere classified  Abnormal posture  Localized edema     Problem List Patient Active Problem List   Diagnosis Date Noted  . Status post left rotator cuff repair 01/27/2019  . Pain in left shoulder 11/28/2018  . Encounter for screening colonoscopy 08/14/2017  . Alcohol abuse, daily use 08/14/2017  . GERD (gastroesophageal reflux disease) 08/29/2010  . Hx of adenomatous colonic polyps 08/29/2010  . FHx: colon cancer 08/29/2010      Laureen Abrahams, PT, DPT 02/18/19 11:04 AM     [facility] Physical Therapy [address] [place], [place], [address] Phone: [phone]   Fax:  [phone]  Name: Louis Mccullough MRN: [id number] Date of Birth: 1955/05/16

## 2019-02-19 ENCOUNTER — Ambulatory Visit (INDEPENDENT_AMBULATORY_CARE_PROVIDER_SITE_OTHER): Payer: No Typology Code available for payment source | Admitting: Orthopaedic Surgery

## 2019-02-19 ENCOUNTER — Encounter: Payer: Self-pay | Admitting: Orthopaedic Surgery

## 2019-02-19 VITALS — Ht 69.0 in | Wt 205.0 lb

## 2019-02-19 DIAGNOSIS — M25512 Pain in left shoulder: Secondary | ICD-10-CM

## 2019-02-19 DIAGNOSIS — G8929 Other chronic pain: Secondary | ICD-10-CM

## 2019-02-19 NOTE — Progress Notes (Signed)
Office Visit Note   Patient: Louis Mccullough           Date of Birth: 1956-02-25           MRN: [id number] Visit Date: 02/19/2019              Requested by: Asencion Noble, MD [address] [place],  [place] [address] PCP: Asencion Noble, MD   Assessment & Plan: Visit Diagnoses:  1. Chronic left shoulder pain     Plan: 1 month status post rotator cuff tear repair left shoulder with biceps tenodesis with an SCD and DCR.  Doing quite well.  Was still wearing the sling and attending physical therapy here in the office.  Very happy with the results so far.  May discontinue the sling in 2 weeks and continue therapy we will check him back in a month  Follow-Up Instructions: Return in about 1 month (around 03/22/2019).   Orders:  No orders of the defined types were placed in this encounter.  No orders of the defined types were placed in this encounter.     Procedures: No procedures performed   Clinical Data: No additional findings.   Subjective: Chief Complaint  Patient presents with  . Left Shoulder - Routine Post Op    Left shoulder scope DOS 01/20/2019  Patient presents today for follow up on his left shoulder. He had a left shoulder arthroscopy on 01/20/2019. He is now 1 month out from surgery. Patient states that he is doing well. He has been going to therapy at least twice weekly. He is not in any pain. Is he still wearing his sling.  HPI  Review of Systems  Constitutional: Positive for fatigue.  HENT: Negative for ear pain.   Eyes: Negative for pain.  Respiratory: Negative for shortness of breath.   Cardiovascular: Negative for leg swelling.  Gastrointestinal: Negative for constipation and diarrhea.  Endocrine: Negative for cold intolerance and heat intolerance.  Genitourinary: Negative for difficulty urinating.  Musculoskeletal: Negative for joint swelling.  Skin: Negative for rash.  Allergic/Immunologic: Negative for food allergies.  Neurological: Negative for  weakness.  Hematological: Does not bruise/bleed easily.  Psychiatric/Behavioral: Positive for sleep disturbance.     Objective: Vital Signs: Ht 5\' 9"  (1.753 m)   Wt 205 lb (93 kg)   BMI 30.27 kg/m   Physical Exam  Ortho Exam awake alert and oriented x3 comfortable sitting.  No distress.  Left upper extremity in a sling.  Incisions are healing without problem.  I was able to passively abduct about 90 degrees and flex about 120 degrees I think it could have gone higher but did not want to cause him any discomfort.  Skin intact.  Neurologically intact  Specialty Comments:  No specialty comments available.  Imaging: No results found.   PMFS History: Patient Active Problem List   Diagnosis Date Noted  . Status post left rotator cuff repair 01/27/2019  . Pain in left shoulder 11/28/2018  . Encounter for screening colonoscopy 08/14/2017  . Alcohol abuse, daily use 08/14/2017  . GERD (gastroesophageal reflux disease) 08/29/2010  . Hx of adenomatous colonic polyps 08/29/2010  . FHx: colon cancer 08/29/2010   Past Medical History:  Diagnosis Date  . GERD (gastroesophageal reflux disease)    states he had previous EGD  . Gout   . History of bronchitis   . HTN (hypertension)   . Hx of adenomatous colonic polyps   . Hyperlipidemia   . Hypertriglyceridemia   .  Hypothyroidism   . Seasonal allergies   . Shoulder impingement, left   . Wears glasses     Family History  Problem Relation Age of Onset  . Colon cancer Maternal Grandfather 45       died age 84  . Prostate cancer Father 73       died age 61    Past Surgical History:  Procedure Laterality Date  . BILATERAL CARPAL TUNNEL RELEASE Bilateral 02/12/2014   Procedure: BILATERAL CARPAL TUNNEL RELEASE;  Surgeon: Daryll Brod, MD;  Location: [place];  Service: Orthopedics;  Laterality: Bilateral;  . CHOLECYSTECTOMY    . COLONOSCOPY  2006   adenomatous polyp, diverticulosis  . COLONOSCOPY  09/29/2010    Procedure: COLONOSCOPY;  Surgeon: Daneil Dolin, MD;  [facility];  Service: Endoscopy;  Laterality: N/A;  . COLONOSCOPY WITH PROPOFOL N/A 10/24/2017   Procedure: COLONOSCOPY WITH PROPOFOL;  Surgeon: Daneil Dolin, MD;  [facility];  Service: Endoscopy;  Laterality: N/A;  11:00am  . NECK SURGERY  2001   cerv fusion  . POLYPECTOMY  10/24/2017   Procedure: POLYPECTOMY;  Surgeon: Daneil Dolin, MD;  [facility];  Service: Endoscopy;;  splenic flexure and hepatic flexure, descending colon  . ROTATOR CUFF REPAIR  2006   righrt  . SHOULDER ARTHROSCOPY WITH SUBACROMIAL DECOMPRESSION, ROTATOR CUFF REPAIR AND BICEP TENDON REPAIR Left 01/20/2019   Procedure: left shoulder arthroscopy, subacromial decompression, distal clavicle resection, mini open rotator cuff repair;  Surgeon: Garald Balding, MD;  Location: [place];  Service: Orthopedics;  Laterality: Left;  . TONSILLECTOMY     Social History   Occupational History    Employer: [employer]  Tobacco Use  . Smoking status: Current Some Day Smoker    Packs/day: 0.30    Types: Cigarettes  . Smokeless tobacco: Never Used  . Tobacco comment: smokes when he drinks which is daily  Substance and Sexual Activity  . Alcohol use: Yes    Comment: 2-3 drinks a day  . Drug use: No  . Sexual activity: Not on file

## 2019-02-20 ENCOUNTER — Other Ambulatory Visit: Payer: Self-pay

## 2019-02-20 ENCOUNTER — Ambulatory Visit (INDEPENDENT_AMBULATORY_CARE_PROVIDER_SITE_OTHER): Payer: No Typology Code available for payment source | Admitting: Physical Therapy

## 2019-02-20 DIAGNOSIS — R293 Abnormal posture: Secondary | ICD-10-CM

## 2019-02-20 DIAGNOSIS — M25612 Stiffness of left shoulder, not elsewhere classified: Secondary | ICD-10-CM | POA: Diagnosis not present

## 2019-02-20 DIAGNOSIS — R600 Localized edema: Secondary | ICD-10-CM | POA: Diagnosis not present

## 2019-02-20 DIAGNOSIS — M25512 Pain in left shoulder: Secondary | ICD-10-CM | POA: Diagnosis not present

## 2019-02-20 NOTE — Therapy (Signed)
[facility] Physical Therapy [address] [place], [place], [address] Phone: [phone]   Fax:  [phone]  Physical Therapy Treatment  Patient Details  Name: Louis Mccullough MRN: [id number] Date of Birth: 1955-11-09 Referring Provider (PT): Cherylann Ratel, [place]   Encounter Date: 02/20/2019  PT End of Session - 02/20/19 1137    Visit Number  8    Number of Visits  30    Date for PT Re-Evaluation  04/22/19    PT Start Time  [id number]    PT Stop Time  1058    PT Time Calculation (min)  43 min    Activity Tolerance  Patient tolerated treatment well    Behavior During Therapy  [facility] for tasks assessed/performed       Past Medical History:  Diagnosis Date  . GERD (gastroesophageal reflux disease)    states he had previous EGD  . Gout   . History of bronchitis   . HTN (hypertension)   . Hx of adenomatous colonic polyps   . Hyperlipidemia   . Hypertriglyceridemia   . Hypothyroidism   . Seasonal allergies   . Shoulder impingement, left   . Wears glasses     Past Surgical History:  Procedure Laterality Date  . BILATERAL CARPAL TUNNEL RELEASE Bilateral 02/12/2014   Procedure: BILATERAL CARPAL TUNNEL RELEASE;  Surgeon: Daryll Brod, MD;  Location: [place];  Service: Orthopedics;  Laterality: Bilateral;  . CHOLECYSTECTOMY    . COLONOSCOPY  2006   adenomatous polyp, diverticulosis  . COLONOSCOPY  09/29/2010   Procedure: COLONOSCOPY;  Surgeon: Daneil Dolin, MD;  [facility];  Service: Endoscopy;  Laterality: N/A;  . COLONOSCOPY WITH PROPOFOL N/A 10/24/2017   Procedure: COLONOSCOPY WITH PROPOFOL;  Surgeon: Daneil Dolin, MD;  [facility];  Service: Endoscopy;  Laterality: N/A;  11:00am  . NECK SURGERY  2001   cerv fusion  . POLYPECTOMY  10/24/2017   Procedure: POLYPECTOMY;  Surgeon: Daneil Dolin, MD;  [facility];  Service: Endoscopy;;  splenic flexure and hepatic flexure, descending colon  . ROTATOR CUFF  REPAIR  2006   righrt  . SHOULDER ARTHROSCOPY WITH SUBACROMIAL DECOMPRESSION, ROTATOR CUFF REPAIR AND BICEP TENDON REPAIR Left 01/20/2019   Procedure: left shoulder arthroscopy, subacromial decompression, distal clavicle resection, mini open rotator cuff repair;  Surgeon: Garald Balding, MD;  Location: [place];  Service: Orthopedics;  Laterality: Left;  . TONSILLECTOMY      There were no vitals filed for this visit.  Subjective Assessment - 02/20/19 1049    Subjective  relays no pain upon arrival by increased soreness after MD appointment. He does say MD was pleased with his progress.    Pertinent History  Rt RTC repair    Limitations  Lifting    Patient Stated Goals  improve function and strength           [facility] Adult PT Treatment/Exercise - 02/20/19 0001      Elbow Exercises   Elbow Flexion  20 reps;Left;Seated      Shoulder Exercises: Seated   Other Seated Exercises  shoulder rolls (forward/backwards)/scapular retraction x 20 reps each    Other Seated Exercises  scapular depression into red physioball 20x5 sec, seated supination/pronation ROM with elbow supported in chair X20 reps     Shoulder Exercises: Standing   Other Standing Exercises  pendulums X 20 ea plane    Other Standing Exercises  standing scap retraction into  red ball cues not to move his elbows but to only squeeze his shoulder blades      Shoulder Exercises: Therapy Ball   Flexion  Both;20 reps    Flexion Limitations  keeping Lt shoulder passive    Scaption  Left;20 reps    Scaption Limitations  keeping Lt shoulder passive      Vasopneumatic   Number Minutes Vasopneumatic   10 minutes    Vasopnuematic Location   Shoulder    Vasopneumatic Pressure  Medium    Vasopneumatic Temperature   34 deg      Manual Therapy   Manual Therapy  Passive ROM    Passive ROM  gentle Lt shoulder flexion/abduction/Er/IR to tolerance               PT Short Term Goals - 02/18/19 1100      PT  SHORT TERM GOAL #1   Title  independent with initial HEP    Status  Achieved    Target Date  03/11/19      PT SHORT TERM GOAL #2   Title  demonstrate improved PROM Lt shoulder by at least 20 degrees flexion, abduction, external rotation for improved function    Status  Achieved    Target Date  03/11/19        PT Long Term Goals - 01/28/19 1301      PT LONG TERM GOAL #1   Title  independent with advanced HEP    Status  New    Target Date  04/22/19      PT LONG TERM GOAL #2   Title  demonstrate Lt shoulder AROM at least 75% of Rt shoulder for improved function    Status  New    Target Date  04/22/19      PT LONG TERM GOAL #3   Title  report pain < 4/10 with activity for improved function    Status  New    Target Date  04/22/19      PT LONG TERM GOAL #4   Title  strengthening program initated and progressed in order to progress strength    Status  New    Target Date  04/22/19            Plan - 02/20/19 1138    Clinical Impression Statement  He is progressing well with ROM. Session focused on PROM as he is not yet 6 weeks to begin AROM. Used vasopnuematic post tx due to soreness and he relays relief from this. PT will continue with current POC per protocol.    Examination-Activity Limitations  Bathing;Bed Mobility;Toileting;Reach Overhead;Self Feeding;Carry;Sleep;Hygiene/Grooming;Dressing    Examination-Participation Restrictions  Other;Driving    Rehab Potential  Good    PT Frequency  3x / week    PT Duration  12 weeks    PT Treatment/Interventions  ADLs/Self Care Home Management;Cryotherapy;Electrical Stimulation;Ultrasound;Moist Heat;Therapeutic exercise;Patient/family education;Manual techniques;Scar mobilization;Vasopneumatic Device;Taping;Passive range of motion;Dry needling    PT Next Visit Plan  PROM to tolerance; modalities PRN for pain, wrist/hand AROM    PT Home Exercise Plan  Access Code: [place]       Patient will benefit from skilled therapeutic  intervention in order to improve the following deficits and impairments:  Pain, Postural dysfunction, Increased edema, Impaired UE functional use, Decreased strength, Decreased range of motion  Visit Diagnosis: Acute pain of left shoulder  Stiffness of left shoulder, not elsewhere classified  Abnormal posture  Localized edema     Problem List Patient Active Problem  List   Diagnosis Date Noted  . Status post left rotator cuff repair 01/27/2019  . Pain in left shoulder 11/28/2018  . Encounter for screening colonoscopy 08/14/2017  . Alcohol abuse, daily use 08/14/2017  . GERD (gastroesophageal reflux disease) 08/29/2010  . Hx of adenomatous colonic polyps 08/29/2010  . FHx: colon cancer 08/29/2010    Louis Mccullough 02/20/2019, 11:40 AM  [facility] Physical Therapy [address] [place], [place], [address] Phone: [phone]   Fax:  [phone]  Name: Louis Mccullough MRN: [id number] Date of Birth: [birth year]

## 2019-02-23 ENCOUNTER — Encounter: Payer: No Typology Code available for payment source | Admitting: Physical Therapy

## 2019-02-25 ENCOUNTER — Encounter: Payer: Self-pay | Admitting: Physical Therapy

## 2019-02-25 ENCOUNTER — Encounter: Payer: No Typology Code available for payment source | Admitting: Physical Therapy

## 2019-02-25 ENCOUNTER — Other Ambulatory Visit: Payer: Self-pay

## 2019-02-25 ENCOUNTER — Ambulatory Visit: Payer: No Typology Code available for payment source | Admitting: Physical Therapy

## 2019-02-25 DIAGNOSIS — R293 Abnormal posture: Secondary | ICD-10-CM

## 2019-02-25 DIAGNOSIS — M25612 Stiffness of left shoulder, not elsewhere classified: Secondary | ICD-10-CM | POA: Diagnosis not present

## 2019-02-25 DIAGNOSIS — R600 Localized edema: Secondary | ICD-10-CM | POA: Diagnosis not present

## 2019-02-25 DIAGNOSIS — M25512 Pain in left shoulder: Secondary | ICD-10-CM | POA: Diagnosis not present

## 2019-02-25 NOTE — Therapy (Signed)
[facility] Physical Therapy [address] [place], [place], [address] Phone: [phone]   Fax:  [phone]  Physical Therapy Treatment  Patient Details  Name: Louis Mccullough MRN: [id number] Date of Birth: 1955/05/11 Referring Provider (PT): Cherylann Ratel, [place]   Encounter Date: 02/25/2019  PT End of Session - 02/25/19 1240    Visit Number  9    Number of Visits  30    Date for PT Re-Evaluation  04/22/19    PT Start Time  [id number]    PT Stop Time  1225    PT Time Calculation (min)  40 min    Activity Tolerance  Patient tolerated treatment well    Behavior During Therapy  [facility] for tasks assessed/performed       Past Medical History:  Diagnosis Date  . GERD (gastroesophageal reflux disease)    states he had previous EGD  . Gout   . History of bronchitis   . HTN (hypertension)   . Hx of adenomatous colonic polyps   . Hyperlipidemia   . Hypertriglyceridemia   . Hypothyroidism   . Seasonal allergies   . Shoulder impingement, left   . Wears glasses     Past Surgical History:  Procedure Laterality Date  . BILATERAL CARPAL TUNNEL RELEASE Bilateral 02/12/2014   Procedure: BILATERAL CARPAL TUNNEL RELEASE;  Surgeon: Daryll Brod, MD;  Location: [place];  Service: Orthopedics;  Laterality: Bilateral;  . CHOLECYSTECTOMY    . COLONOSCOPY  2006   adenomatous polyp, diverticulosis  . COLONOSCOPY  09/29/2010   Procedure: COLONOSCOPY;  Surgeon: Daneil Dolin, MD;  [facility];  Service: Endoscopy;  Laterality: N/A;  . COLONOSCOPY WITH PROPOFOL N/A 10/24/2017   Procedure: COLONOSCOPY WITH PROPOFOL;  Surgeon: Daneil Dolin, MD;  [facility];  Service: Endoscopy;  Laterality: N/A;  11:00am  . NECK SURGERY  2001   cerv fusion  . POLYPECTOMY  10/24/2017   Procedure: POLYPECTOMY;  Surgeon: Daneil Dolin, MD;  [facility];  Service: Endoscopy;;  splenic flexure and hepatic flexure, descending colon  . ROTATOR CUFF  REPAIR  2006   righrt  . SHOULDER ARTHROSCOPY WITH SUBACROMIAL DECOMPRESSION, ROTATOR CUFF REPAIR AND BICEP TENDON REPAIR Left 01/20/2019   Procedure: left shoulder arthroscopy, subacromial decompression, distal clavicle resection, mini open rotator cuff repair;  Surgeon: Garald Balding, MD;  Location: [place];  Service: Orthopedics;  Laterality: Left;  . TONSILLECTOMY      There were no vitals filed for this visit.  Subjective Assessment - 02/25/19 1147    Subjective  doing well; will be 6 weeks post-op next week    Pertinent History  Rt RTC repair    Limitations  Lifting    Patient Stated Goals  improve function and strength    Currently in Pain?  No/denies                       [facility] Adult PT Treatment/Exercise - 02/25/19 1148      Elbow Exercises   Elbow Flexion  20 reps;Left;Seated      Shoulder Exercises: Seated   Other Seated Exercises  shoulder rolls (forward/backwards)/scapular retraction x 20 reps each    Other Seated Exercises  scapular depression into red physioball 20x5 sec      Shoulder Exercises: Therapy Ball   Flexion  Both;20 reps    Flexion Limitations  keeping Lt shoulder passive    Scaption  Left;20 reps    Scaption Limitations  keeping Lt shoulder passive      Manual Therapy   Manual Therapy  Passive ROM    Passive ROM  gentle Lt shoulder flexion/abduction/Er/IR to tolerance               PT Short Term Goals - 02/18/19 1100      PT SHORT TERM GOAL #1   Title  independent with initial HEP    Status  Achieved    Target Date  03/11/19      PT SHORT TERM GOAL #2   Title  demonstrate improved PROM Lt shoulder by at least 20 degrees flexion, abduction, external rotation for improved function    Status  Achieved    Target Date  03/11/19        PT Long Term Goals - 01/28/19 1301      PT LONG TERM GOAL #1   Title  independent with advanced HEP    Status  New    Target Date  04/22/19      PT LONG TERM  GOAL #2   Title  demonstrate Lt shoulder AROM at least 75% of Rt shoulder for improved function    Status  New    Target Date  04/22/19      PT LONG TERM GOAL #3   Title  report pain < 4/10 with activity for improved function    Status  New    Target Date  04/22/19      PT LONG TERM GOAL #4   Title  strengthening program initated and progressed in order to progress strength    Status  New    Target Date  04/22/19            Plan - 02/25/19 1240    Clinical Impression Statement  Pt continues to progress well with PT, and will be ready for some AA exercises next week.  Continue per POC.    Examination-Activity Limitations  Bathing;Bed Mobility;Toileting;Reach Overhead;Self Feeding;Carry;Sleep;Hygiene/Grooming;Dressing    Examination-Participation Restrictions  Other;Driving    Rehab Potential  Good    PT Frequency  3x / week    PT Duration  12 weeks    PT Treatment/Interventions  ADLs/Self Care Home Management;Cryotherapy;Electrical Stimulation;Ultrasound;Moist Heat;Therapeutic exercise;Patient/family education;Manual techniques;Scar mobilization;Vasopneumatic Device;Taping;Passive range of motion;Dry needling    PT Next Visit Plan  begin AAROM and measure if past 6 weeks    PT Home Exercise Plan  Access Code: [email or web address]       Patient will benefit from skilled therapeutic intervention in order to improve the following deficits and impairments:  Pain, Postural dysfunction, Increased edema, Impaired UE functional use, Decreased strength, Decreased range of motion  Visit Diagnosis: Acute pain of left shoulder  Stiffness of left shoulder, not elsewhere classified  Abnormal posture  Localized edema     Problem List Patient Active Problem List   Diagnosis Date Noted  . Status post left rotator cuff repair 01/27/2019  . Pain in left shoulder 11/28/2018  . Encounter for screening colonoscopy 08/14/2017  . Alcohol abuse, daily use 08/14/2017  . GERD (gastroesophageal reflux  disease) 08/29/2010  . Hx of adenomatous colonic polyps 08/29/2010  . FHx: colon cancer 08/29/2010      Laureen Abrahams, PT, DPT 02/25/19 12:42 PM     [facility] Physical Therapy [address] [place], [place], [address] Phone: [phone]   Fax:  [phone]  Name: ELISHAH METHOT MRN: [id number] Date of Birth: 02-17-1956

## 2019-02-27 ENCOUNTER — Encounter: Payer: No Typology Code available for payment source | Admitting: Physical Therapy

## 2019-03-02 ENCOUNTER — Encounter: Payer: No Typology Code available for payment source | Admitting: Physical Therapy

## 2019-03-03 ENCOUNTER — Ambulatory Visit (INDEPENDENT_AMBULATORY_CARE_PROVIDER_SITE_OTHER): Payer: No Typology Code available for payment source | Admitting: Physical Therapy

## 2019-03-03 ENCOUNTER — Other Ambulatory Visit: Payer: Self-pay

## 2019-03-03 DIAGNOSIS — M25612 Stiffness of left shoulder, not elsewhere classified: Secondary | ICD-10-CM | POA: Diagnosis not present

## 2019-03-03 DIAGNOSIS — R293 Abnormal posture: Secondary | ICD-10-CM | POA: Diagnosis not present

## 2019-03-03 DIAGNOSIS — M25512 Pain in left shoulder: Secondary | ICD-10-CM | POA: Diagnosis not present

## 2019-03-03 DIAGNOSIS — R600 Localized edema: Secondary | ICD-10-CM

## 2019-03-03 NOTE — Patient Instructions (Signed)
Access Code: [email or web address]  URL: [email or web address]  Date: 03/03/2019  Prepared by: Elsie Ra   Exercises  Supine Shoulder Press AAROM in Abduction with Dowel - 10 reps - 1-2 sets - 2x daily - 6x weekly  Supine Shoulder Flexion with Dowel - 10 reps - 1-2 sets - 2x daily - 6x weekly  Supine Shoulder Abduction AAROM with Dowel - 10 reps - 1-2 sets - 2x daily - 6x weekly  Sidelying Shoulder External Rotation - 10 reps - 1-2 sets - 2x daily - 6x weekly  Standing Shoulder Flexion Wall Walk - 10 reps - 1 sets - 5 sec hold - 2x daily - 6x weekly  Standing Shoulder Scaption Wall Walk - 10 reps - 1 sets - 5 sec hold - 2x daily - 6x weekly  Standing Bent Over Single Arm Scapular Row with Table Support - 10 reps - 1-2 sets - 2x daily - 6x weekly

## 2019-03-03 NOTE — Therapy (Signed)
[facility] Physical Therapy [address] [place], [place], [address] Phone: [phone]   Fax:  [phone]  Physical Therapy Treatment  Patient Details  Name: Louis Mccullough MRN: [id number] Date of Birth: 1955-09-19 Referring Provider (PT): Cherylann Ratel, [place]   Encounter Date: 03/03/2019  PT End of Session - 03/03/19 1141    Visit Number  10    Number of Visits  30    Date for PT Re-Evaluation  04/22/19    PT Start Time  1055    PT Stop Time  1140    PT Time Calculation (min)  45 min    Activity Tolerance  Patient tolerated treatment well    Behavior During Therapy  [facility] for tasks assessed/performed       Past Medical History:  Diagnosis Date  . GERD (gastroesophageal reflux disease)    states he had previous EGD  . Gout   . History of bronchitis   . HTN (hypertension)   . Hx of adenomatous colonic polyps   . Hyperlipidemia   . Hypertriglyceridemia   . Hypothyroidism   . Seasonal allergies   . Shoulder impingement, left   . Wears glasses     Past Surgical History:  Procedure Laterality Date  . BILATERAL CARPAL TUNNEL RELEASE Bilateral 02/12/2014   Procedure: BILATERAL CARPAL TUNNEL RELEASE;  Surgeon: Daryll Brod, MD;  Location: [place];  Service: Orthopedics;  Laterality: Bilateral;  . CHOLECYSTECTOMY    . COLONOSCOPY  2006   adenomatous polyp, diverticulosis  . COLONOSCOPY  09/29/2010   Procedure: COLONOSCOPY;  Surgeon: Daneil Dolin, MD;  [facility];  Service: Endoscopy;  Laterality: N/A;  . COLONOSCOPY WITH PROPOFOL N/A 10/24/2017   Procedure: COLONOSCOPY WITH PROPOFOL;  Surgeon: Daneil Dolin, MD;  [facility];  Service: Endoscopy;  Laterality: N/A;  11:00am  . NECK SURGERY  2001   cerv fusion  . POLYPECTOMY  10/24/2017   Procedure: POLYPECTOMY;  Surgeon: Daneil Dolin, MD;  [facility];  Service: Endoscopy;;  splenic flexure and hepatic flexure, descending colon  . ROTATOR CUFF  REPAIR  2006   righrt  . SHOULDER ARTHROSCOPY WITH SUBACROMIAL DECOMPRESSION, ROTATOR CUFF REPAIR AND BICEP TENDON REPAIR Left 01/20/2019   Procedure: left shoulder arthroscopy, subacromial decompression, distal clavicle resection, mini open rotator cuff repair;  Surgeon: Garald Balding, MD;  Location: [place];  Service: Orthopedics;  Laterality: Left;  . TONSILLECTOMY      There were no vitals filed for this visit.  Subjective Assessment - 03/03/19 1110    Subjective  relays his shoulder is doing well, no complaints or pain upon arrival.    Pertinent History  Rt RTC repair    Limitations  Lifting    Patient Stated Goals  improve function and strength    Currently in Pain?  No/denies                       [facility] Adult PT Treatment/Exercise - 03/03/19 0001      Shoulder Exercises: Supine   Other Supine Exercises  supine 1 lb dow for chest press, shoulder flexion AAROM, shoulder abd AAROM X 10 reps ea      Shoulder Exercises: Sidelying   External Rotation  Left;10 reps      Shoulder Exercises: Standing   Flexion  --    ABduction  --    ABduction Limitations  --    Other Standing  Exercises  wall ladder X 10 flexion, X 5 scaption (holding 5 sec ea)    Other Standing Exercises  bent over row and ext  X10 reps ea on Lt side      Shoulder Exercises: Pulleys   Flexion  2 minutes    ABduction  2 minutes      Shoulder Exercises: ROM/Strengthening   UBE (Upper Arm Bike)  2 min fwd, 2 min retro, no resistance      Manual Therapy   Passive ROM  gentle Lt shoulder flexion/abduction/Er/IR to tolerance        PT Education - 03/03/19 1141    Education Details  HEP progression    Person(s) Educated  Patient    Methods  Explanation;Demonstration;Verbal cues;Handout    Comprehension  Verbalized understanding;Returned demonstration       PT Short Term Goals - 02/18/19 1100      PT SHORT TERM GOAL #1   Title  independent with initial HEP     Status  Achieved    Target Date  03/11/19      PT SHORT TERM GOAL #2   Title  demonstrate improved PROM Lt shoulder by at least 20 degrees flexion, abduction, external rotation for improved function    Status  Achieved    Target Date  03/11/19        PT Long Term Goals - 01/28/19 1301      PT LONG TERM GOAL #1   Title  independent with advanced HEP    Status  New    Target Date  04/22/19      PT LONG TERM GOAL #2   Title  demonstrate Lt shoulder AROM at least 75% of Rt shoulder for improved function    Status  New    Target Date  04/22/19      PT LONG TERM GOAL #3   Title  report pain < 4/10 with activity for improved function    Status  New    Target Date  04/22/19      PT LONG TERM GOAL #4   Title  strengthening program initated and progressed in order to progress strength    Status  New    Target Date  04/22/19            Plan - 03/03/19 1142    Clinical Impression Statement  He is 6 weeks post op so began AAROM and AROM activities with good tolerance. His HEP was updated to reflect his progression. Has good PROM and will measure AROM next vsit.    Examination-Activity Limitations  Bathing;Bed Mobility;Toileting;Reach Overhead;Self Feeding;Carry;Sleep;Hygiene/Grooming;Dressing    Examination-Participation Restrictions  Other;Driving    Rehab Potential  Good    PT Frequency  3x / week    PT Duration  12 weeks    PT Treatment/Interventions  ADLs/Self Care Home Management;Cryotherapy;Electrical Stimulation;Ultrasound;Moist Heat;Therapeutic exercise;Patient/family education;Manual techniques;Scar mobilization;Vasopneumatic Device;Taping;Passive range of motion;Dry needling    PT Next Visit Plan  progress AROM/AAROM and measure    PT Home Exercise Plan  Access Code: [email or web address]    Consulted and Agree with Plan of Care  Patient       Patient will benefit from skilled therapeutic intervention in order to improve the following deficits and impairments:  Pain, Postural  dysfunction, Increased edema, Impaired UE functional use, Decreased strength, Decreased range of motion  Visit Diagnosis: Acute pain of left shoulder  Stiffness of left shoulder, not elsewhere classified  Abnormal posture  Localized edema  Problem List Patient Active Problem List   Diagnosis Date Noted  . Status post left rotator cuff repair 01/27/2019  . Pain in left shoulder 11/28/2018  . Encounter for screening colonoscopy 08/14/2017  . Alcohol abuse, daily use 08/14/2017  . GERD (gastroesophageal reflux disease) 08/29/2010  . Hx of adenomatous colonic polyps 08/29/2010  . FHx: colon cancer 08/29/2010    Debbe Odea ,PT,DPT 03/03/2019, 11:43 AM  [facility] Physical Therapy [address] [place], [place], [address] Phone: [phone]   Fax:  [phone]  Name: Louis Mccullough MRN: [id number] Date of Birth: 26-Aug-1955

## 2019-03-04 ENCOUNTER — Encounter: Payer: Self-pay | Admitting: Physical Therapy

## 2019-03-04 ENCOUNTER — Ambulatory Visit (INDEPENDENT_AMBULATORY_CARE_PROVIDER_SITE_OTHER): Payer: No Typology Code available for payment source | Admitting: Physical Therapy

## 2019-03-04 DIAGNOSIS — R293 Abnormal posture: Secondary | ICD-10-CM

## 2019-03-04 DIAGNOSIS — M25612 Stiffness of left shoulder, not elsewhere classified: Secondary | ICD-10-CM | POA: Diagnosis not present

## 2019-03-04 DIAGNOSIS — M25512 Pain in left shoulder: Secondary | ICD-10-CM | POA: Diagnosis not present

## 2019-03-04 DIAGNOSIS — R600 Localized edema: Secondary | ICD-10-CM | POA: Diagnosis not present

## 2019-03-04 NOTE — Therapy (Signed)
[facility] Physical Therapy [address] [place], [place], [address] Phone: [phone]   Fax:  [phone]  Physical Therapy Treatment  Patient Details  Name: Louis Mccullough MRN: [id number] Date of Birth: [birth year] Referring Provider (PT): Cherylann Ratel, [place]   Encounter Date: 03/04/2019  PT End of Session - 03/04/19 1052    Visit Number  11    Number of Visits  30    Date for PT Re-Evaluation  04/22/19    PT Start Time  [id number]    PT Stop Time  1054    PT Time Calculation (min)  39 min    Activity Tolerance  Patient tolerated treatment well    Behavior During Therapy  [facility] for tasks assessed/performed       Past Medical History:  Diagnosis Date  . GERD (gastroesophageal reflux disease)    states he had previous EGD  . Gout   . History of bronchitis   . HTN (hypertension)   . Hx of adenomatous colonic polyps   . Hyperlipidemia   . Hypertriglyceridemia   . Hypothyroidism   . Seasonal allergies   . Shoulder impingement, left   . Wears glasses     Past Surgical History:  Procedure Laterality Date  . BILATERAL CARPAL TUNNEL RELEASE Bilateral 02/12/2014   Procedure: BILATERAL CARPAL TUNNEL RELEASE;  Surgeon: Daryll Brod, MD;  Location: [place];  Service: Orthopedics;  Laterality: Bilateral;  . CHOLECYSTECTOMY    . COLONOSCOPY  2006   adenomatous polyp, diverticulosis  . COLONOSCOPY  09/29/2010   Procedure: COLONOSCOPY;  Surgeon: Daneil Dolin, MD;  [facility];  Service: Endoscopy;  Laterality: N/A;  . COLONOSCOPY WITH PROPOFOL N/A 10/24/2017   Procedure: COLONOSCOPY WITH PROPOFOL;  Surgeon: Daneil Dolin, MD;  [facility];  Service: Endoscopy;  Laterality: N/A;  11:00am  . NECK SURGERY  2001   cerv fusion  . POLYPECTOMY  10/24/2017   Procedure: POLYPECTOMY;  Surgeon: Daneil Dolin, MD;  [facility];  Service: Endoscopy;;  splenic flexure and hepatic flexure, descending colon  . ROTATOR CUFF  REPAIR  2006   righrt  . SHOULDER ARTHROSCOPY WITH SUBACROMIAL DECOMPRESSION, ROTATOR CUFF REPAIR AND BICEP TENDON REPAIR Left 01/20/2019   Procedure: left shoulder arthroscopy, subacromial decompression, distal clavicle resection, mini open rotator cuff repair;  Surgeon: Garald Balding, MD;  Location: [place];  Service: Orthopedics;  Laterality: Left;  . TONSILLECTOMY      There were no vitals filed for this visit.  Subjective Assessment - 03/04/19 1017    Subjective  shoulder is sore today after being able to start moving it yesterday; slept in bed last night for first time.    Pertinent History  Rt RTC repair    Limitations  Lifting    Patient Stated Goals  improve function and strength    Currently in Pain?  No/denies                       [facility] Adult PT Treatment/Exercise - 03/04/19 1018      Shoulder Exercises: Supine   Other Supine Exercises  supine 1 lb dow for chest press, shoulder flexion AAROM, shoulder er AAROM X 15 reps ea      Shoulder Exercises: Standing   Extension  Both;15 reps;Theraband    Theraband Level (Shoulder Extension)  Level 1 (Yellow)    Extension Limitations  5 sec hold    Row  Both;15 reps;Theraband    Theraband Level (Shoulder Row)  Level 1 (Yellow)    Row Limitations  5 sec hold    Other Standing Exercises  wall ladder X 10 flexion, X 5 scaption (holding 5 sec ea)      Shoulder Exercises: Pulleys   Flexion  2 minutes    Scaption  2 minutes               PT Short Term Goals - 02/18/19 1100      PT SHORT TERM GOAL #1   Title  independent with initial HEP    Status  Achieved    Target Date  03/11/19      PT SHORT TERM GOAL #2   Title  demonstrate improved PROM Lt shoulder by at least 20 degrees flexion, abduction, external rotation for improved function    Status  Achieved    Target Date  03/11/19        PT Long Term Goals - 01/28/19 1301      PT LONG TERM GOAL #1   Title  independent with  advanced HEP    Status  New    Target Date  04/22/19      PT LONG TERM GOAL #2   Title  demonstrate Lt shoulder AROM at least 75% of Rt shoulder for improved function    Status  New    Target Date  04/22/19      PT LONG TERM GOAL #3   Title  report pain < 4/10 with activity for improved function    Status  New    Target Date  04/22/19      PT LONG TERM GOAL #4   Title  strengthening program initated and progressed in order to progress strength    Status  New    Target Date  04/22/19            Plan - 03/04/19 1052    Clinical Impression Statement  Pt tolerated session well today despite soreness from yesterday's session.  Progressing well with PT at this time.    Examination-Activity Limitations  Bathing;Bed Mobility;Toileting;Reach Overhead;Self Feeding;Carry;Sleep;Hygiene/Grooming;Dressing    Examination-Participation Restrictions  Other;Driving    Rehab Potential  Good    PT Frequency  3x / week    PT Duration  12 weeks    PT Treatment/Interventions  ADLs/Self Care Home Management;Cryotherapy;Electrical Stimulation;Ultrasound;Moist Heat;Therapeutic exercise;Patient/family education;Manual techniques;Scar mobilization;Vasopneumatic Device;Taping;Passive range of motion;Dry needling    PT Next Visit Plan  progress AROM/AAROM and measure    PT Home Exercise Plan  Access Code: [email or web address]    Consulted and Agree with Plan of Care  Patient       Patient will benefit from skilled therapeutic intervention in order to improve the following deficits and impairments:  Pain, Postural dysfunction, Increased edema, Impaired UE functional use, Decreased strength, Decreased range of motion  Visit Diagnosis: Acute pain of left shoulder  Stiffness of left shoulder, not elsewhere classified  Abnormal posture  Localized edema     Problem List Patient Active Problem List   Diagnosis Date Noted  . Status post left rotator cuff repair 01/27/2019  . Pain in left shoulder 11/28/2018   . Encounter for screening colonoscopy 08/14/2017  . Alcohol abuse, daily use 08/14/2017  . GERD (gastroesophageal reflux disease) 08/29/2010  . Hx of adenomatous colonic polyps 08/29/2010  . FHx: colon cancer 08/29/2010      Laureen Abrahams, PT, DPT 03/04/19 10:55 AM    [facility]  Physical Therapy [address] [place], [place], [address] Phone: [phone]   Fax:  [phone]  Name: Louis Mccullough MRN: [id number] Date of Birth: 08/11/1955

## 2019-03-09 ENCOUNTER — Ambulatory Visit (INDEPENDENT_AMBULATORY_CARE_PROVIDER_SITE_OTHER): Payer: No Typology Code available for payment source | Admitting: Physical Therapy

## 2019-03-09 ENCOUNTER — Encounter: Payer: Self-pay | Admitting: Physical Therapy

## 2019-03-09 DIAGNOSIS — M25512 Pain in left shoulder: Secondary | ICD-10-CM

## 2019-03-09 DIAGNOSIS — R600 Localized edema: Secondary | ICD-10-CM

## 2019-03-09 DIAGNOSIS — R293 Abnormal posture: Secondary | ICD-10-CM

## 2019-03-09 DIAGNOSIS — M25612 Stiffness of left shoulder, not elsewhere classified: Secondary | ICD-10-CM

## 2019-03-09 NOTE — Therapy (Signed)
[facility] Physical Therapy [address] [place], [place], [address] Phone: [phone]   Fax:  [phone]  Physical Therapy Treatment  Patient Details  Name: Louis Mccullough MRN: [id number] Date of Birth: [birth year] Referring Provider (PT): Cherylann Ratel, [place]   Encounter Date: 03/09/2019  PT End of Session - 03/09/19 1005    Visit Number  12    Number of Visits  30    Date for PT Re-Evaluation  04/22/19    PT Start Time  0925    PT Stop Time  1008    PT Time Calculation (min)  43 min    Activity Tolerance  Patient tolerated treatment well    Behavior During Therapy  [facility] for tasks assessed/performed       Past Medical History:  Diagnosis Date  . GERD (gastroesophageal reflux disease)    states he had previous EGD  . Gout   . History of bronchitis   . HTN (hypertension)   . Hx of adenomatous colonic polyps   . Hyperlipidemia   . Hypertriglyceridemia   . Hypothyroidism   . Seasonal allergies   . Shoulder impingement, left   . Wears glasses     Past Surgical History:  Procedure Laterality Date  . BILATERAL CARPAL TUNNEL RELEASE Bilateral 02/12/2014   Procedure: BILATERAL CARPAL TUNNEL RELEASE;  Surgeon: Daryll Brod, MD;  Location: [place];  Service: Orthopedics;  Laterality: Bilateral;  . CHOLECYSTECTOMY    . COLONOSCOPY  2006   adenomatous polyp, diverticulosis  . COLONOSCOPY  09/29/2010   Procedure: COLONOSCOPY;  Surgeon: Daneil Dolin, MD;  [facility];  Service: Endoscopy;  Laterality: N/A;  . COLONOSCOPY WITH PROPOFOL N/A 10/24/2017   Procedure: COLONOSCOPY WITH PROPOFOL;  Surgeon: Daneil Dolin, MD;  [facility];  Service: Endoscopy;  Laterality: N/A;  11:00am  . NECK SURGERY  2001   cerv fusion  . POLYPECTOMY  10/24/2017   Procedure: POLYPECTOMY;  Surgeon: Daneil Dolin, MD;  [facility];  Service: Endoscopy;;  splenic flexure and hepatic flexure, descending colon  . ROTATOR CUFF  REPAIR  2006   righrt  . SHOULDER ARTHROSCOPY WITH SUBACROMIAL DECOMPRESSION, ROTATOR CUFF REPAIR AND BICEP TENDON REPAIR Left 01/20/2019   Procedure: left shoulder arthroscopy, subacromial decompression, distal clavicle resection, mini open rotator cuff repair;  Surgeon: Garald Balding, MD;  Location: [place];  Service: Orthopedics;  Laterality: Left;  . TONSILLECTOMY      There were no vitals filed for this visit.  Subjective Assessment - 03/09/19 0926    Subjective  doing well; shoulder overall doing well.  just having some soreness.    Pertinent History  Rt RTC repair    Limitations  Lifting    Patient Stated Goals  improve function and strength    Currently in Pain?  No/denies                       [facility] Adult PT Treatment/Exercise - 03/09/19 0927      Shoulder Exercises: Supine   Flexion  Left;20 reps;AROM   starting at ~ 20 deg flexion   Other Supine Exercises  supine 2 lb dow for chest press, shoulder flexion AAROM, shoulder er AAROM X 15 reps ea      Shoulder Exercises: Sidelying   External Rotation  Left;20 reps    ABduction  Left;20 reps      Shoulder Exercises: Standing   Other  Standing Exercises  wall ladder X 10 flexion (5 sec hold)      Shoulder Exercises: Pulleys   Flexion  3 minutes    Scaption  3 minutes      Shoulder Exercises: Isometric Strengthening   Flexion Limitations  10x5"    External Rotation Limitations  10x5"    Internal Rotation Limitations  10x5"      Manual Therapy   Passive ROM  gentle Lt shoulder flexion/abduction/Er/IR to tolerance               PT Short Term Goals - 02/18/19 1100      PT SHORT TERM GOAL #1   Title  independent with initial HEP    Status  Achieved    Target Date  03/11/19      PT SHORT TERM GOAL #2   Title  demonstrate improved PROM Lt shoulder by at least 20 degrees flexion, abduction, external rotation for improved function    Status  Achieved    Target Date   03/11/19        PT Long Term Goals - 01/28/19 1301      PT LONG TERM GOAL #1   Title  independent with advanced HEP    Status  New    Target Date  04/22/19      PT LONG TERM GOAL #2   Title  demonstrate Lt shoulder AROM at least 75% of Rt shoulder for improved function    Status  New    Target Date  04/22/19      PT LONG TERM GOAL #3   Title  report pain < 4/10 with activity for improved function    Status  New    Target Date  04/22/19      PT LONG TERM GOAL #4   Title  strengthening program initated and progressed in order to progress strength    Status  New    Target Date  04/22/19            Plan - 03/09/19 1006    Clinical Impression Statement  Pt continues to progress well with PT with expected soreness from increased activity.  Will continue to benefit from PT to maximize function.    Examination-Activity Limitations  Bathing;Bed Mobility;Toileting;Reach Overhead;Self Feeding;Carry;Sleep;Hygiene/Grooming;Dressing    Examination-Participation Restrictions  Other;Driving    Rehab Potential  Good    PT Frequency  3x / week    PT Duration  12 weeks    PT Treatment/Interventions  ADLs/Self Care Home Management;Cryotherapy;Electrical Stimulation;Ultrasound;Moist Heat;Therapeutic exercise;Patient/family education;Manual techniques;Scar mobilization;Vasopneumatic Device;Taping;Passive range of motion;Dry needling    PT Next Visit Plan  progress AROM/AAROM and measure    PT Home Exercise Plan  Access Code: [email or web address]    Consulted and Agree with Plan of Care  Patient       Patient will benefit from skilled therapeutic intervention in order to improve the following deficits and impairments:  Pain, Postural dysfunction, Increased edema, Impaired UE functional use, Decreased strength, Decreased range of motion  Visit Diagnosis: Acute pain of left shoulder  Stiffness of left shoulder, not elsewhere classified  Abnormal posture  Localized edema     Problem  List Patient Active Problem List   Diagnosis Date Noted  . Status post left rotator cuff repair 01/27/2019  . Pain in left shoulder 11/28/2018  . Encounter for screening colonoscopy 08/14/2017  . Alcohol abuse, daily use 08/14/2017  . GERD (gastroesophageal reflux disease) 08/29/2010  . Hx of adenomatous colonic polyps 08/29/2010  .  FHx: colon cancer 08/29/2010      Laureen Abrahams, PT, DPT 03/09/19 10:07 AM     [facility] Physical Therapy [address] [place], [place], [address] Phone: [phone]   Fax:  [phone]  Name: RISHAV MAYE MRN: [id number] Date of Birth: 1955/09/15

## 2019-03-11 ENCOUNTER — Other Ambulatory Visit: Payer: Self-pay

## 2019-03-11 ENCOUNTER — Ambulatory Visit: Payer: No Typology Code available for payment source | Admitting: Physical Therapy

## 2019-03-11 DIAGNOSIS — R600 Localized edema: Secondary | ICD-10-CM | POA: Diagnosis not present

## 2019-03-11 DIAGNOSIS — M25512 Pain in left shoulder: Secondary | ICD-10-CM

## 2019-03-11 DIAGNOSIS — M25612 Stiffness of left shoulder, not elsewhere classified: Secondary | ICD-10-CM

## 2019-03-11 DIAGNOSIS — R293 Abnormal posture: Secondary | ICD-10-CM

## 2019-03-11 NOTE — Therapy (Signed)
[facility] Physical Therapy [address] [place], [place], [address] Phone: [phone]   Fax:  [phone]  Physical Therapy Treatment  Patient Details  Name: Louis Mccullough MRN: [id number] Date of Birth: 27-May-1955 Referring Provider (PT): Cherylann Ratel, [place]   Encounter Date: 03/11/2019  PT End of Session - 03/11/19 1154    Visit Number  13    Number of Visits  30    Date for PT Re-Evaluation  04/22/19    PT Start Time  [id number]    PT Stop Time  1218    PT Time Calculation (min)  43 min    Activity Tolerance  Patient tolerated treatment well    Behavior During Therapy  [facility] for tasks assessed/performed       Past Medical History:  Diagnosis Date  . GERD (gastroesophageal reflux disease)    states he had previous EGD  . Gout   . History of bronchitis   . HTN (hypertension)   . Hx of adenomatous colonic polyps   . Hyperlipidemia   . Hypertriglyceridemia   . Hypothyroidism   . Seasonal allergies   . Shoulder impingement, left   . Wears glasses     Past Surgical History:  Procedure Laterality Date  . BILATERAL CARPAL TUNNEL RELEASE Bilateral 02/12/2014   Procedure: BILATERAL CARPAL TUNNEL RELEASE;  Surgeon: Daryll Brod, MD;  Location: [place];  Service: Orthopedics;  Laterality: Bilateral;  . CHOLECYSTECTOMY    . COLONOSCOPY  2006   adenomatous polyp, diverticulosis  . COLONOSCOPY  09/29/2010   Procedure: COLONOSCOPY;  Surgeon: Daneil Dolin, MD;  [facility];  Service: Endoscopy;  Laterality: N/A;  . COLONOSCOPY WITH PROPOFOL N/A 10/24/2017   Procedure: COLONOSCOPY WITH PROPOFOL;  Surgeon: Daneil Dolin, MD;  [facility];  Service: Endoscopy;  Laterality: N/A;  11:00am  . NECK SURGERY  2001   cerv fusion  . POLYPECTOMY  10/24/2017   Procedure: POLYPECTOMY;  Surgeon: Daneil Dolin, MD;  [facility];  Service: Endoscopy;;  splenic flexure and hepatic flexure, descending colon  . ROTATOR CUFF  REPAIR  2006   righrt  . SHOULDER ARTHROSCOPY WITH SUBACROMIAL DECOMPRESSION, ROTATOR CUFF REPAIR AND BICEP TENDON REPAIR Left 01/20/2019   Procedure: left shoulder arthroscopy, subacromial decompression, distal clavicle resection, mini open rotator cuff repair;  Surgeon: Garald Balding, MD;  Location: [place];  Service: Orthopedics;  Laterality: Left;  . TONSILLECTOMY      There were no vitals filed for this visit.  Subjective Assessment - 03/11/19 1153    Subjective  shoulder overall doing well no pain upon arrival just having some soreness.    Pertinent History  Rt RTC repair    Limitations  Lifting    Patient Stated Goals  improve function and strength    Currently in Pain?  No/denies    Pain Onset  1 to 4 weeks ago         [facility] PT Assessment - 03/11/19 0001      Assessment   Medical Diagnosis  Lt RTC repair    Referring Provider (PT)  Cherylann Ratel, PA-C    Onset Date/Surgical Date  01/20/19      ROM / Strength   AROM / PROM / Strength  AROM;PROM      PROM   Left Shoulder Flexion  160 Degrees    Left Shoulder ABduction  155 Degrees    Left Shoulder External Rotation  70 Degrees                   [facility] Adult PT Treatment/Exercise - 03/11/19 0001      Shoulder Exercises: Supine   Other Supine Exercises  --      Shoulder Exercises: Sidelying   External Rotation  Left;20 reps    ABduction  --      Shoulder Exercises: Standing   Extension  20 reps    Theraband Level (Shoulder Extension)  Level 1 (Yellow)    Row  20 reps    Theraband Level (Shoulder Row)  Level 1 (Yellow)    Other Standing Exercises  wall ladder X 10 flexion then scaption (5 sec hold)      Shoulder Exercises: Pulleys   Flexion  2 minutes    Scaption  2 minutes      Shoulder Exercises: ROM/Strengthening   UBE (Upper Arm Bike)  2 min fwd, 2 min retro, no resistance    Ranger  X15 reps flexion, then cirlcles at 90 deg flexion CW and CCW      Shoulder  Exercises: Isometric Strengthening   Flexion Limitations  10x5"    External Rotation Limitations  10x5"    Internal Rotation Limitations  10x5"      Manual Therapy   Passive ROM  gentle Lt shoulder flexion/abduction/Er/IR to tolerance               PT Short Term Goals - 02/18/19 1100      PT SHORT TERM GOAL #1   Title  independent with initial HEP    Status  Achieved    Target Date  03/11/19      PT SHORT TERM GOAL #2   Title  demonstrate improved PROM Lt shoulder by at least 20 degrees flexion, abduction, external rotation for improved function    Status  Achieved    Target Date  03/11/19        PT Long Term Goals - 03/11/19 1206      PT LONG TERM GOAL #1   Title  independent with advanced HEP    Status  On-going      PT LONG TERM GOAL #2   Title  demonstrate Lt shoulder AROM at least 75% of Rt shoulder for improved function    Status  On-going      PT LONG TERM GOAL #3   Title  report pain < 4/10 with activity for improved function    Status  On-going      PT LONG TERM GOAL #4   Title  strengthening program initated and progressed in order to progress strength    Status  On-going            Plan - 03/11/19 1155    Clinical Impression Statement  Making good progress with ROM and having good tolerance to light strengthening. PT will continue to progress as able toward his funcitonal goals.    Examination-Activity Limitations  Bathing;Bed Mobility;Toileting;Reach Overhead;Self Feeding;Carry;Sleep;Hygiene/Grooming;Dressing    Examination-Participation Restrictions  Other;Driving    Rehab Potential  Good    PT Frequency  3x / week    PT Duration  12 weeks    PT Treatment/Interventions  ADLs/Self Care Home Management;Cryotherapy;Electrical Stimulation;Ultrasound;Moist Heat;Therapeutic exercise;Patient/family education;Manual techniques;Scar mobilization;Vasopneumatic Device;Taping;Passive range of motion;Dry needling    PT Next Visit Plan  progress AROM,  light strengthening and measure    PT Home Exercise Plan  Access Code: [place] with  Plan of Care  Patient       Patient will benefit from skilled therapeutic intervention in order to improve the following deficits and impairments:  Pain, Postural dysfunction, Increased edema, Impaired UE functional use, Decreased strength, Decreased range of motion  Visit Diagnosis: Acute pain of left shoulder  Stiffness of left shoulder, not elsewhere classified  Abnormal posture  Localized edema     Problem List Patient Active Problem List   Diagnosis Date Noted  . Status post left rotator cuff repair 01/27/2019  . Pain in left shoulder 11/28/2018  . Encounter for screening colonoscopy 08/14/2017  . Alcohol abuse, daily use 08/14/2017  . GERD (gastroesophageal reflux disease) 08/29/2010  . Hx of adenomatous colonic polyps 08/29/2010  . FHx: colon cancer 08/29/2010    Silvestre Mesi 03/11/2019, 1:49 PM  [facility] Physical Therapy [address] [place], [place], [address] Phone: [phone]   Fax:  [phone]  Name: Louis Mccullough MRN: [id number] Date of Birth: [birth year]

## 2019-03-12 ENCOUNTER — Encounter

## 2019-03-16 MED FILL — HYDROCHLOROTHIAZIDE 25 MG T: 25 | 90 days supply | Qty: 90 | Fill #2

## 2019-03-16 MED FILL — TELMISARTAN 80 MG TABLET: 80 | 90 days supply | Qty: 90 | Fill #2

## 2019-03-16 MED FILL — DICLOFENAC SOD EC 50 MG TAB: 50 | 30 days supply | Qty: 90 | Fill #2

## 2019-03-16 MED FILL — PANTOPRAZOLE SOD DR 40 MG T: 40 | 90 days supply | Qty: 90 | Fill #2

## 2019-03-16 MED FILL — METHOCARBAMOL 500 MG TABS: 500 | 30 days supply | Qty: 90 | Fill #0

## 2019-03-16 MED FILL — FLUTICASONE PROP 50 MCG SPR: 50 | 90 days supply | Qty: 48 | Fill #2

## 2019-03-16 MED FILL — AMLODIPINE BESYLATE 10 MG T: 10 | 90 days supply | Qty: 90 | Fill #2

## 2019-03-16 MED FILL — SIMVASTATIN 20 MG TABLET: 20 | 90 days supply | Qty: 90 | Fill #2

## 2019-03-16 MED FILL — LEVOTHYROXINE SODIUM 112 MC: 112 | 90 days supply | Qty: 90 | Fill #2

## 2019-03-16 MED FILL — ALLOPURINOL 300 MG TABS: 300 | 90 days supply | Qty: 90 | Fill #2

## 2019-03-18 ENCOUNTER — Other Ambulatory Visit: Payer: No Typology Code available for payment source

## 2019-03-18 ENCOUNTER — Encounter: Payer: No Typology Code available for payment source | Admitting: Physical Therapy

## 2019-03-19 ENCOUNTER — Other Ambulatory Visit: Payer: No Typology Code available for payment source

## 2019-03-20 ENCOUNTER — Other Ambulatory Visit: Payer: Self-pay

## 2019-03-20 ENCOUNTER — Ambulatory Visit: Payer: No Typology Code available for payment source | Attending: Internal Medicine

## 2019-03-20 DIAGNOSIS — Z20822 Contact with and (suspected) exposure to covid-19: Secondary | ICD-10-CM

## 2019-03-22 LAB — NOVEL CORONAVIRUS, NAA: SARS-CoV-2, NAA: NOT DETECTED

## 2019-03-24 ENCOUNTER — Ambulatory Visit (INDEPENDENT_AMBULATORY_CARE_PROVIDER_SITE_OTHER): Payer: No Typology Code available for payment source | Admitting: Orthopaedic Surgery

## 2019-03-24 ENCOUNTER — Encounter: Payer: Self-pay | Admitting: Orthopaedic Surgery

## 2019-03-24 ENCOUNTER — Ambulatory Visit (INDEPENDENT_AMBULATORY_CARE_PROVIDER_SITE_OTHER): Payer: No Typology Code available for payment source | Admitting: Physical Therapy

## 2019-03-24 ENCOUNTER — Other Ambulatory Visit: Payer: Self-pay

## 2019-03-24 VITALS — Ht 69.0 in | Wt 205.0 lb

## 2019-03-24 DIAGNOSIS — M25512 Pain in left shoulder: Secondary | ICD-10-CM

## 2019-03-24 DIAGNOSIS — R293 Abnormal posture: Secondary | ICD-10-CM

## 2019-03-24 DIAGNOSIS — Z9889 Other specified postprocedural states: Secondary | ICD-10-CM

## 2019-03-24 DIAGNOSIS — R600 Localized edema: Secondary | ICD-10-CM | POA: Diagnosis not present

## 2019-03-24 DIAGNOSIS — M25612 Stiffness of left shoulder, not elsewhere classified: Secondary | ICD-10-CM | POA: Diagnosis not present

## 2019-03-24 NOTE — Therapy (Signed)
[facility] Physical Therapy [address] [place], [place], [address] Phone: [phone]   Fax:  [phone]  Physical Therapy Treatment/MD progress note  Patient Details  Name: Louis Mccullough MRN: [id number] Date of Birth: 28-Jul-1955 Referring Provider (PT): Cherylann Ratel, [place]   Encounter Date: 03/24/2019  PT End of Session - 03/24/19 1233    Visit Number  14    Number of Visits  30    Date for PT Re-Evaluation  04/22/19    PT Start Time  1147    PT Stop Time  1230    PT Time Calculation (min)  43 min    Activity Tolerance  Patient tolerated treatment well    Behavior During Therapy  [facility] for tasks assessed/performed       Past Medical History:  Diagnosis Date  . GERD (gastroesophageal reflux disease)    states he had previous EGD  . Gout   . History of bronchitis   . HTN (hypertension)   . Hx of adenomatous colonic polyps   . Hyperlipidemia   . Hypertriglyceridemia   . Hypothyroidism   . Seasonal allergies   . Shoulder impingement, left   . Wears glasses     Past Surgical History:  Procedure Laterality Date  . BILATERAL CARPAL TUNNEL RELEASE Bilateral 02/12/2014   Procedure: BILATERAL CARPAL TUNNEL RELEASE;  Surgeon: Daryll Brod, MD;  Location: [place];  Service: Orthopedics;  Laterality: Bilateral;  . CHOLECYSTECTOMY    . COLONOSCOPY  2006   adenomatous polyp, diverticulosis  . COLONOSCOPY  09/29/2010   Procedure: COLONOSCOPY;  Surgeon: Daneil Dolin, MD;  [facility];  Service: Endoscopy;  Laterality: N/A;  . COLONOSCOPY WITH PROPOFOL N/A 10/24/2017   Procedure: COLONOSCOPY WITH PROPOFOL;  Surgeon: Daneil Dolin, MD;  [facility];  Service: Endoscopy;  Laterality: N/A;  11:00am  . NECK SURGERY  2001   cerv fusion  . POLYPECTOMY  10/24/2017   Procedure: POLYPECTOMY;  Surgeon: Daneil Dolin, MD;  [facility];  Service: Endoscopy;;  splenic flexure and hepatic flexure, descending colon   . ROTATOR CUFF REPAIR  2006   righrt  . SHOULDER ARTHROSCOPY WITH SUBACROMIAL DECOMPRESSION, ROTATOR CUFF REPAIR AND BICEP TENDON REPAIR Left 01/20/2019   Procedure: left shoulder arthroscopy, subacromial decompression, distal clavicle resection, mini open rotator cuff repair;  Surgeon: Garald Balding, MD;  Location: [place];  Service: Orthopedics;  Laterality: Left;  . TONSILLECTOMY      There were no vitals filed for this visit.  Subjective Assessment - 03/24/19 1151    Subjective  Shoulder is doing good, no pain, just a touch of soreness    Pertinent History  Rt RTC repair    Limitations  Lifting    Patient Stated Goals  improve function and strength    Pain Onset  1 to 4 weeks ago         [facility] PT Assessment - 03/24/19 0001      Assessment   Medical Diagnosis  Lt RTC repair    Referring Provider (PT)  Cherylann Ratel, PA-C    Onset Date/Surgical Date  01/20/19      AROM   Right Shoulder Flexion  160 Degrees    Right Shoulder ABduction  130 Degrees   WFL for scaption   Right Shoulder Internal Rotation  --   WNl   Right Shoulder External Rotation  --   WNL  [place] Adult PT Treatment/Exercise - 03/24/19 0001      Shoulder Exercises: Prone   Flexion  Left;10 reps    Horizontal ABduction 1  Left;10 reps      Shoulder Exercises: Sidelying   External Rotation  Left;20 reps      Shoulder Exercises: Standing   External Rotation  Left;20 reps    Theraband Level (Shoulder External Rotation)  Level 2 (Red)    Internal Rotation  Left;20 reps    Theraband Level (Shoulder Internal Rotation)  Level 2 (Red)    Extension  20 reps    Theraband Level (Shoulder Extension)  Level 2 (Red);Level 1 (Yellow)    Row  20 reps    Theraband Level (Shoulder Row)  Level 2 (Red)    Other Standing Exercises  --    Other Standing Exercises  pickup and row for 10 lb KB 3 reps progressed to 15 lbs for 3 reps no pain reported      Shoulder  Exercises: Pulleys   Flexion  2 minutes    Scaption  2 minutes      Shoulder Exercises: ROM/Strengthening   UBE (Upper Arm Bike)  2 min fwd, 2 min retro, no resistance    [place]anger  X15 reps flexion, then cirlcles at 90 deg flexion CW and CCW      Shoulder Exercises: Isometric Strengthening   Flexion Limitations  --    External Rotation Limitations  --    Internal Rotation Limitations  --      Manual Therapy   Passive ROM  Lt shoulder flexion/abduction/Er/IR to tolerance               PT Short Term Goals - 02/18/19 1100      PT SHORT TERM GOAL #1   Title  independent with initial HEP    Status  Achieved    Target Date  03/11/19      PT SHORT TERM GOAL #2   Title  demonstrate improved PROM Lt shoulder by at least 20 degrees flexion, abduction, external rotation for improved function    Status  Achieved    Target Date  03/11/19        PT Long Term Goals - 03/24/19 1237      PT LONG TERM GOAL #1   Title  independent with advanced HEP    Status  On-going      PT LONG TERM GOAL #2   Title  demonstrate Lt shoulder AROM at least 75% of Rt shoulder for improved function    Status  Achieved      PT LONG TERM GOAL #3   Title  report pain < 4/10 with activity for improved function    Status  On-going      PT LONG TERM GOAL #4   Title  strengthening program initated and progressed in order to progress strength    Status  Achieved            Plan - 03/24/19 1234    Clinical Impression Statement  He has made excellent progress and now has WFL ROM. His strength is improving and he was able to pick up and row 10-15 lbs today without pain or difficulty. He does still lack some overhead strength and endurance and will benefit from few more weeks of PT.    Examination-Activity Limitations  Bathing;Bed Mobility;Toileting;Reach Overhead;Self Feeding;Carry;Sleep;Hygiene/Grooming;Dressing    Examination-Participation Restrictions  Other;Driving    Rehab Potential  Good     PT Frequency  3x / week    PT Duration  12 weeks    PT Treatment/Interventions  ADLs/Self Care Home Management;Cryotherapy;Electrical Stimulation;Ultrasound;Moist Heat;Therapeutic exercise;Patient/family education;Manual techniques;Scar mobilization;Vasopneumatic Device;Taping;Passive range of motion;Dry needling    PT Next Visit Plan  progress AROM, light strengthening and measure    PT Home Exercise Plan  Access Code: [email or web address]    Consulted and Agree with Plan of Care  Patient       Patient will benefit from skilled therapeutic intervention in order to improve the following deficits and impairments:  Pain, Postural dysfunction, Increased edema, Impaired UE functional use, Decreased strength, Decreased range of motion  Visit Diagnosis: Acute pain of left shoulder  Stiffness of left shoulder, not elsewhere classified  Abnormal posture  Localized edema     Problem List Patient Active Problem List   Diagnosis Date Noted  . Status post left rotator cuff repair 01/27/2019  . Pain in left shoulder 11/28/2018  . Encounter for screening colonoscopy 08/14/2017  . Alcohol abuse, daily use 08/14/2017  . GERD (gastroesophageal reflux disease) 08/29/2010  . Hx of adenomatous colonic polyps 08/29/2010  . FHx: colon cancer 08/29/2010    Silvestre Mesi 03/24/2019, 12:39 PM  [facility] Physical Therapy [address] [place], [place], [address] Phone: [phone]   Fax:  [phone]  Name: Louis Mccullough MRN: [id number] Date of Birth: [birth year]

## 2019-03-24 NOTE — Progress Notes (Signed)
Office Visit Note   Patient: Louis Mccullough           Date of Birth: 1955/06/28           MRN: [id number] Visit Date: 03/24/2019              Requested by: Garald Balding, MD [address] [place],  [place] [address] PCP: Asencion Noble, MD   Assessment & Plan: Visit Diagnoses:  1. S/P left rotator cuff repair     Plan:  #1: Continue physical therapy #2: Once he completes his physical therapy then he can start to resume normal activities needed between 3 to 6 months postop. #3: Follow back up as needed  Follow-Up Instructions: Return if symptoms worsen or fail to improve.   Orders:  No orders of the defined types were placed in this encounter.  No orders of the defined types were placed in this encounter.     Procedures: No procedures performed   Clinical Data: No additional findings.   Subjective: Chief Complaint  Patient presents with  . Left Shoulder - Follow-up    Left shoulder scope DOS 01/20/2019  Patient presents today for follow up on his left shoulder. He had a left shoulder arthroscopy on 01/20/2019. He is now two months out from surgery. Doing well. He is going to therapy twice weekly. No complaints. Not taking anything for pain.   HPI  Review of Systems  Constitutional: Negative for fatigue.  HENT: Negative for ear pain.   Eyes: Negative for pain.  Respiratory: Negative for shortness of breath.   Cardiovascular: Negative for leg swelling.  Gastrointestinal: Negative for constipation and diarrhea.  Endocrine: Negative for cold intolerance and heat intolerance.  Genitourinary: Negative for difficulty urinating.  Musculoskeletal: Negative for joint swelling.  Skin: Negative for rash.  Allergic/Immunologic: Negative for food allergies.  Neurological: Negative for weakness.  Hematological: Does not bruise/bleed easily.  Psychiatric/Behavioral: Negative for sleep disturbance.     Objective: Vital Signs: Ht 5\' 9"  (1.753 m)   Wt 205 lb (93 kg)    BMI 30.27 kg/m   Physical Exam  Ortho Exam  Exam today reveals good motion almost 160 degrees of forward flexion and abduction.  With the arm at 90 degrees of abduction he has 90 degrees of external rotation around 70 degrees of internal rotation.  He has actually good strength in all planes tested.  Neuro vas intact distally.  Wound is well-healed.  Specialty Comments:  No specialty comments available.  Imaging: No results found.   PMFS History: Current Outpatient Medications  Medication Sig Dispense Refill  . allopurinol (ZYLOPRIM) 300 MG tablet Take 300 mg by mouth daily.      Marland Kitchen amLODipine (NORVASC) 10 MG tablet Take 10 mg by mouth daily.      Marland Kitchen aspirin 81 MG tablet Take 81 mg by mouth daily.    . fluticasone (FLONASE) 50 MCG/ACT nasal spray Place 2 sprays into both nostrils daily as needed for allergies.     . hydrochlorothiazide (HYDRODIURIL) 25 MG tablet Take 25 mg by mouth daily.    Marland Kitchen levothyroxine (SYNTHROID, LEVOTHROID) 112 MCG tablet Take 112 mcg by mouth daily.  4  . methocarbamol (ROBAXIN) 500 MG tablet Take 500 mg by mouth daily as needed for muscle spasms.   4  . MICARDIS 80 MG tablet Take 80 mg by mouth daily.     . pantoprazole (PROTONIX) 40 MG tablet Take 40 mg by mouth daily.  4  .  simvastatin (ZOCOR) 20 MG tablet Take 20 mg by mouth at bedtime.       No current facility-administered medications for this visit.    Patient Active Problem List   Diagnosis Date Noted  . S/P left rotator cuff repair 01/27/2019  . Pain in left shoulder 11/28/2018  . Encounter for screening colonoscopy 08/14/2017  . Alcohol abuse, daily use 08/14/2017  . GERD (gastroesophageal reflux disease) 08/29/2010  . Hx of adenomatous colonic polyps 08/29/2010  . FHx: colon cancer 08/29/2010   Past Medical History:  Diagnosis Date  . GERD (gastroesophageal reflux disease)    states he had previous EGD  . Gout   . History of bronchitis   . HTN (hypertension)   . Hx of adenomatous  colonic polyps   . Hyperlipidemia   . Hypertriglyceridemia   . Hypothyroidism   . Seasonal allergies   . Shoulder impingement, left   . Wears glasses     Family History  Problem Relation Age of Onset  . Colon cancer Maternal Grandfather 15       died age 31  . Prostate cancer Father 43       died age 74    Past Surgical History:  Procedure Laterality Date  . BILATERAL CARPAL TUNNEL RELEASE Bilateral 02/12/2014   Procedure: BILATERAL CARPAL TUNNEL RELEASE;  Surgeon: Daryll Brod, MD;  Location: [place];  Service: Orthopedics;  Laterality: Bilateral;  . CHOLECYSTECTOMY    . COLONOSCOPY  2006   adenomatous polyp, diverticulosis  . COLONOSCOPY  09/29/2010   Procedure: COLONOSCOPY;  Surgeon: Daneil Dolin, MD;  [facility];  Service: Endoscopy;  Laterality: N/A;  . COLONOSCOPY WITH PROPOFOL N/A 10/24/2017   Procedure: COLONOSCOPY WITH PROPOFOL;  Surgeon: Daneil Dolin, MD;  [facility];  Service: Endoscopy;  Laterality: N/A;  11:00am  . NECK SURGERY  2001   cerv fusion  . POLYPECTOMY  10/24/2017   Procedure: POLYPECTOMY;  Surgeon: Daneil Dolin, MD;  [facility];  Service: Endoscopy;;  splenic flexure and hepatic flexure, descending colon  . ROTATOR CUFF REPAIR  2006   righrt  . SHOULDER ARTHROSCOPY WITH SUBACROMIAL DECOMPRESSION, ROTATOR CUFF REPAIR AND BICEP TENDON REPAIR Left 01/20/2019   Procedure: left shoulder arthroscopy, subacromial decompression, distal clavicle resection, mini open rotator cuff repair;  Surgeon: Garald Balding, MD;  Location: [place];  Service: Orthopedics;  Laterality: Left;  . TONSILLECTOMY     Social History   Occupational History    Employer: [employer]  Tobacco Use  . Smoking status: Current Some Day Smoker    Packs/day: 0.30    Types: Cigarettes  . Smokeless tobacco: Never Used  . Tobacco comment: smokes when he drinks which is daily  Substance and Sexual Activity  .  Alcohol use: Yes    Comment: 2-3 drinks a day  . Drug use: No  . Sexual activity: Not on file

## 2019-03-26 ENCOUNTER — Ambulatory Visit: Payer: No Typology Code available for payment source | Admitting: Physical Therapy

## 2019-03-26 ENCOUNTER — Other Ambulatory Visit: Payer: Self-pay

## 2019-03-26 DIAGNOSIS — M25612 Stiffness of left shoulder, not elsewhere classified: Secondary | ICD-10-CM

## 2019-03-26 DIAGNOSIS — M25512 Pain in left shoulder: Secondary | ICD-10-CM

## 2019-03-26 DIAGNOSIS — R600 Localized edema: Secondary | ICD-10-CM | POA: Diagnosis not present

## 2019-03-26 DIAGNOSIS — R293 Abnormal posture: Secondary | ICD-10-CM

## 2019-03-26 NOTE — Therapy (Signed)
[facility] Physical Therapy [address] [place], [place], [address] Phone: [phone]   Fax:  [phone]  Physical Therapy Treatment  Patient Details  Name: Louis Mccullough MRN: [id number] Date of Birth: [birth year] Referring Provider (PT): Cherylann Ratel, [place]   Encounter Date: 03/26/2019  PT End of Session - 03/26/19 1235    Visit Number  15    Number of Visits  30    Date for PT Re-Evaluation  04/22/19    PT Start Time  [id number]    PT Stop Time  1225    PT Time Calculation (min)  40 min    Activity Tolerance  Patient tolerated treatment well    Behavior During Therapy  [facility] for tasks assessed/performed       Past Medical History:  Diagnosis Date  . GERD (gastroesophageal reflux disease)    states he had previous EGD  . Gout   . History of bronchitis   . HTN (hypertension)   . Hx of adenomatous colonic polyps   . Hyperlipidemia   . Hypertriglyceridemia   . Hypothyroidism   . Seasonal allergies   . Shoulder impingement, left   . Wears glasses     Past Surgical History:  Procedure Laterality Date  . BILATERAL CARPAL TUNNEL RELEASE Bilateral 02/12/2014   Procedure: BILATERAL CARPAL TUNNEL RELEASE;  Surgeon: Daryll Brod, MD;  Location: [place];  Service: Orthopedics;  Laterality: Bilateral;  . CHOLECYSTECTOMY    . COLONOSCOPY  2006   adenomatous polyp, diverticulosis  . COLONOSCOPY  09/29/2010   Procedure: COLONOSCOPY;  Surgeon: Daneil Dolin, MD;  [facility];  Service: Endoscopy;  Laterality: N/A;  . COLONOSCOPY WITH PROPOFOL N/A 10/24/2017   Procedure: COLONOSCOPY WITH PROPOFOL;  Surgeon: Daneil Dolin, MD;  [facility];  Service: Endoscopy;  Laterality: N/A;  11:00am  . NECK SURGERY  2001   cerv fusion  . POLYPECTOMY  10/24/2017   Procedure: POLYPECTOMY;  Surgeon: Daneil Dolin, MD;  [facility];  Service: Endoscopy;;  splenic flexure and hepatic flexure, descending colon  . ROTATOR CUFF  REPAIR  2006   righrt  . SHOULDER ARTHROSCOPY WITH SUBACROMIAL DECOMPRESSION, ROTATOR CUFF REPAIR AND BICEP TENDON REPAIR Left 01/20/2019   Procedure: left shoulder arthroscopy, subacromial decompression, distal clavicle resection, mini open rotator cuff repair;  Surgeon: Garald Balding, MD;  Location: [place];  Service: Orthopedics;  Laterality: Left;  . TONSILLECTOMY      There were no vitals filed for this visit.  Subjective Assessment - 03/26/19 1225    Subjective  Shoulder is doing good, no pain, just a touch of soreness, MD was pleased with my progress    Pertinent History  Rt RTC repair    Limitations  Lifting    Patient Stated Goals  improve function and strength    Pain Onset  1 to 4 weeks ago       [facility] Adult PT Treatment/Exercise - 03/26/19 0001      Shoulder Exercises: Standing   External Rotation  Left;20 reps    Theraband Level (Shoulder External Rotation)  Level 2 (Red)    Internal Rotation  Left;20 reps    Theraband Level (Shoulder Internal Rotation)  Level 2 (Red)    Flexion  Left;10 reps    Shoulder Flexion Weight (lbs)  1    ABduction  Left;10 reps    Shoulder ABduction Weight (lbs)  1    Extension  20  reps    Theraband Level (Shoulder Extension)  Level 2 (Red)    Row  20 reps    Theraband Level (Shoulder Row)  Level 2 (Red)    Other Standing Exercises  reaching up into top cabinet with 1 lb wt, X 10 reps    Other Standing Exercises  bent over row 10 lb KB X 15 reps, farmers carry 10 lb KB for one lap holding in Lt hand. Bent over y, T, I X 10 reps. (attemped in prone but he is uncomfortable laying prone).      Shoulder Exercises: Pulleys   Flexion  2 minutes    Scaption  2 minutes      Shoulder Exercises: ROM/Strengthening   UBE (Upper Arm Bike)  2 min fwd, 2 min retro, L4    Ranger  X10 reps flexion, then cirlcles at 90 deg flexion CW and CCW, then scaption      Manual Therapy   Passive ROM  declined, feels he does not need it         PT Short Term Goals - 02/18/19 1100      PT SHORT TERM GOAL #1   Title  independent with initial HEP    Status  Achieved    Target Date  03/11/19      PT SHORT TERM GOAL #2   Title  demonstrate improved PROM Lt shoulder by at least 20 degrees flexion, abduction, external rotation for improved function    Status  Achieved    Target Date  03/11/19        PT Long Term Goals - 03/24/19 1237      PT LONG TERM GOAL #1   Title  independent with advanced HEP    Status  On-going      PT LONG TERM GOAL #2   Title  demonstrate Lt shoulder AROM at least 75% of Rt shoulder for improved function    Status  Achieved      PT LONG TERM GOAL #3   Title  report pain < 4/10 with activity for improved function    Status  On-going      PT LONG TERM GOAL #4   Title  strengthening program initated and progressed in order to progress strength    Status  Achieved            Plan - 03/26/19 1236    Clinical Impression Statement  Able to progress strength with good tolerance and no complaints. ROM is [facility] and he declined needing PROM today. PT will continue to progress functional strength as tolerated.    Examination-Activity Limitations  Bathing;Bed Mobility;Toileting;Reach Overhead;Self Feeding;Carry;Sleep;Hygiene/Grooming;Dressing    Examination-Participation Restrictions  Other;Driving    Rehab Potential  Good    PT Frequency  3x / week    PT Duration  12 weeks    PT Treatment/Interventions  ADLs/Self Care Home Management;Cryotherapy;Electrical Stimulation;Ultrasound;Moist Heat;Therapeutic exercise;Patient/family education;Manual techniques;Scar mobilization;Vasopneumatic Device;Taping;Passive range of motion;Dry needling    PT Next Visit Plan  progress AROM, light strengthening and measure    PT Home Exercise Plan  Access Code: [email or web address]    Consulted and Agree with Plan of Care  Patient       Patient will benefit from skilled therapeutic intervention in order to improve the  following deficits and impairments:  Pain, Postural dysfunction, Increased edema, Impaired UE functional use, Decreased strength, Decreased range of motion  Visit Diagnosis: Acute pain of left shoulder  Stiffness of left shoulder, not elsewhere classified  Abnormal posture  Localized edema     Problem List Patient Active Problem List   Diagnosis Date Noted  . S/P left rotator cuff repair 01/27/2019  . Pain in left shoulder 11/28/2018  . Encounter for screening colonoscopy 08/14/2017  . Alcohol abuse, daily use 08/14/2017  . GERD (gastroesophageal reflux disease) 08/29/2010  . Hx of adenomatous colonic polyps 08/29/2010  . FHx: colon cancer 08/29/2010    Silvestre Mesi 03/26/2019, 12:37 PM  [facility] Physical Therapy [address] [place], [place], [address] Phone: [phone]   Fax:  [phone]  Name: Louis Mccullough MRN: [id number] Date of Birth: May 27, 1955

## 2019-03-31 ENCOUNTER — Ambulatory Visit (INDEPENDENT_AMBULATORY_CARE_PROVIDER_SITE_OTHER): Payer: No Typology Code available for payment source | Admitting: Physical Therapy

## 2019-03-31 ENCOUNTER — Other Ambulatory Visit: Payer: Self-pay

## 2019-03-31 DIAGNOSIS — R293 Abnormal posture: Secondary | ICD-10-CM

## 2019-03-31 DIAGNOSIS — M25512 Pain in left shoulder: Secondary | ICD-10-CM

## 2019-03-31 DIAGNOSIS — M25612 Stiffness of left shoulder, not elsewhere classified: Secondary | ICD-10-CM | POA: Diagnosis not present

## 2019-03-31 DIAGNOSIS — R600 Localized edema: Secondary | ICD-10-CM

## 2019-03-31 NOTE — Therapy (Signed)
[facility] Physical Therapy [address] [place], [place], [address] Phone: [phone]   Fax:  [phone]  Physical Therapy Treatment  Patient Details  Name: Louis Mccullough MRN: [id number] Date of Birth: 07/10/1955 Referring Provider (PT): Cherylann Ratel, [place]   Encounter Date: 03/31/2019  PT End of Session - 03/31/19 1235    Visit Number  16    Number of Visits  30    Date for PT Re-Evaluation  04/22/19    PT Start Time  1140    PT Stop Time  1220    PT Time Calculation (min)  40 min    Activity Tolerance  Patient tolerated treatment well    Behavior During Therapy  [facility] for tasks assessed/performed       Past Medical History:  Diagnosis Date  . GERD (gastroesophageal reflux disease)    states he had previous EGD  . Gout   . History of bronchitis   . HTN (hypertension)   . Hx of adenomatous colonic polyps   . Hyperlipidemia   . Hypertriglyceridemia   . Hypothyroidism   . Seasonal allergies   . Shoulder impingement, left   . Wears glasses     Past Surgical History:  Procedure Laterality Date  . BILATERAL CARPAL TUNNEL RELEASE Bilateral 02/12/2014   Procedure: BILATERAL CARPAL TUNNEL RELEASE;  Surgeon: Daryll Brod, MD;  Location: [place];  Service: Orthopedics;  Laterality: Bilateral;  . CHOLECYSTECTOMY    . COLONOSCOPY  2006   adenomatous polyp, diverticulosis  . COLONOSCOPY  09/29/2010   Procedure: COLONOSCOPY;  Surgeon: Daneil Dolin, MD;  [facility];  Service: Endoscopy;  Laterality: N/A;  . COLONOSCOPY WITH PROPOFOL N/A 10/24/2017   Procedure: COLONOSCOPY WITH PROPOFOL;  Surgeon: Daneil Dolin, MD;  [facility];  Service: Endoscopy;  Laterality: N/A;  11:00am  . NECK SURGERY  2001   cerv fusion  . POLYPECTOMY  10/24/2017   Procedure: POLYPECTOMY;  Surgeon: Daneil Dolin, MD;  [facility];  Service: Endoscopy;;  splenic flexure and hepatic flexure, descending colon  . ROTATOR CUFF  REPAIR  2006   righrt  . SHOULDER ARTHROSCOPY WITH SUBACROMIAL DECOMPRESSION, ROTATOR CUFF REPAIR AND BICEP TENDON REPAIR Left 01/20/2019   Procedure: left shoulder arthroscopy, subacromial decompression, distal clavicle resection, mini open rotator cuff repair;  Surgeon: Garald Balding, MD;  Location: [place];  Service: Orthopedics;  Laterality: Left;  . TONSILLECTOMY      There were no vitals filed for this visit.  Subjective Assessment - 03/31/19 1143    Subjective  Ive been using my shoulder more, no complaints, soreness isnt bad at all    Pertinent History  Rt RTC repair    Limitations  Lifting    Patient Stated Goals  improve function and strength    Currently in Pain?  No/denies    Pain Onset  1 to 4 weeks ago         [facility] PT Assessment - 03/31/19 0001      Assessment   Medical Diagnosis  Lt RTC repair    Referring Provider (PT)  Cherylann Ratel, PA-C    Onset Date/Surgical Date  01/20/19      ROM / Strength   AROM / PROM / Strength  Strength      Strength   Overall Strength Comments  Lt shoulder flexion, abd, and ER 4+/5, IR 5/5  [place] Adult PT Treatment/Exercise - 03/31/19 0001      Shoulder Exercises: Standing   External Rotation  Left;15 reps    Theraband Level (Shoulder External Rotation)  Level 3 (Green)    Internal Rotation  Left;20 reps    Theraband Level (Shoulder Internal Rotation)  Level 3 (Green)    Flexion  Left;15 reps    Shoulder Flexion Weight (lbs)  2    ABduction  Left;15 reps    Shoulder ABduction Weight (lbs)  2    Extension  20 reps    Theraband Level (Shoulder Extension)  Level 3 (Green)    Row  20 reps    Theraband Level (Shoulder Row)  Level 3 (Green)    Other Standing Exercises  reaching up into top cabinet with 2 lb wt, X 10 reps, OH shoulder stability rolls with 2 lb ball X 10 sup-inf, lateral, circles    Other Standing Exercises  bent over row 15 lb KB X 15 reps, farmers carry 15  lb KB for two laps holding in Lt hand. Bent over y, T, extensions(2 lbs with ext only) X 15 reps ea. (attemped in prone but he is uncomfortable laying prone).      Shoulder Exercises: Pulleys   Flexion  2 minutes    Scaption  2 minutes      Shoulder Exercises: ROM/Strengthening   UBE (Upper Arm Bike)  2 min fwd, 2 min retro, L4    Ranger  X10 reps flexion, then cirlcles at 90 deg flexion CW and CCW, then scaption      Manual Therapy   Passive ROM   he declined, feels he does not need it               PT Short Term Goals - 02/18/19 1100      PT SHORT TERM GOAL #1   Title  independent with initial HEP    Status  Achieved    Target Date  03/11/19      PT SHORT TERM GOAL #2   Title  demonstrate improved PROM Lt shoulder by at least 20 degrees flexion, abduction, external rotation for improved function    Status  Achieved    Target Date  03/11/19        PT Long Term Goals - 03/24/19 1237      PT LONG TERM GOAL #1   Title  independent with advanced HEP    Status  On-going      PT LONG TERM GOAL #2   Title  demonstrate Lt shoulder AROM at least 75% of Rt shoulder for improved function    Status  Achieved      PT LONG TERM GOAL #3   Title  report pain < 4/10 with activity for improved function    Status  On-going      PT LONG TERM GOAL #4   Title  strengthening program initated and progressed in order to progress strength    Status  Achieved            Plan - 03/31/19 1236    Clinical Impression Statement  He is doing great, has good ROM and only mild strenght deficits now. He was again able to progress shoulder strength and stabilization program without complaints. He will likely be ready for discharge at end of next week    Examination-Activity Limitations  Bathing;Bed Mobility;Toileting;Reach Overhead;Self Feeding;Carry;Sleep;Hygiene/Grooming;Dressing    Examination-Participation Restrictions  Other;Driving    Rehab Potential  Good  PT Frequency  3x /  week    PT Duration  12 weeks    PT Treatment/Interventions  ADLs/Self Care Home Management;Cryotherapy;Electrical Stimulation;Ultrasound;Moist Heat;Therapeutic exercise;Patient/family education;Manual techniques;Scar mobilization;Vasopneumatic Device;Taping;Passive range of motion;Dry needling    PT Next Visit Plan  progress AROM, light strengthening and measure    PT Home Exercise Plan  Access Code: [email or web address]    Consulted and Agree with Plan of Care  Patient       Patient will benefit from skilled therapeutic intervention in order to improve the following deficits and impairments:  Pain, Postural dysfunction, Increased edema, Impaired UE functional use, Decreased strength, Decreased range of motion  Visit Diagnosis: Acute pain of left shoulder  Stiffness of left shoulder, not elsewhere classified  Abnormal posture  Localized edema     Problem List Patient Active Problem List   Diagnosis Date Noted  . S/P left rotator cuff repair 01/27/2019  . Pain in left shoulder 11/28/2018  . Encounter for screening colonoscopy 08/14/2017  . Alcohol abuse, daily use 08/14/2017  . GERD (gastroesophageal reflux disease) 08/29/2010  . Hx of adenomatous colonic polyps 08/29/2010  . FHx: colon cancer 08/29/2010    Silvestre Mesi 03/31/2019, 12:37 PM  [facility] Physical Therapy [address] [place], [place], [address] Phone: [phone]   Fax:  [phone]  Name: Louis Mccullough MRN: [id number] Date of Birth: 10-Oct-1955

## 2019-04-02 ENCOUNTER — Other Ambulatory Visit: Payer: Self-pay

## 2019-04-02 ENCOUNTER — Ambulatory Visit (INDEPENDENT_AMBULATORY_CARE_PROVIDER_SITE_OTHER): Payer: No Typology Code available for payment source | Admitting: Physical Therapy

## 2019-04-02 DIAGNOSIS — R293 Abnormal posture: Secondary | ICD-10-CM | POA: Diagnosis not present

## 2019-04-02 DIAGNOSIS — M25512 Pain in left shoulder: Secondary | ICD-10-CM | POA: Diagnosis not present

## 2019-04-02 DIAGNOSIS — M25612 Stiffness of left shoulder, not elsewhere classified: Secondary | ICD-10-CM | POA: Diagnosis not present

## 2019-04-02 DIAGNOSIS — R600 Localized edema: Secondary | ICD-10-CM

## 2019-04-02 NOTE — Therapy (Signed)
[facility] [address] [place], [place], [address] Phone: [phone]   Fax:  [phone]  Physical Therapy Treatment  Patient Details  Name: Louis Mccullough MRN: [id number] Date of Birth: 1955-06-14 Referring Provider (PT): Cherylann Ratel, [place]   Encounter Date: 04/02/2019  PT End of Session - 04/02/19 1225    Visit Number  17    Number of Visits  30    Date for PT Re-Evaluation  04/22/19    PT Start Time  1147    PT Stop Time  1225    PT Time Calculation (min)  38 min    Activity Tolerance  Patient tolerated treatment well    Behavior During Therapy  [facility] for tasks assessed/performed       Past Medical History:  Diagnosis Date  . GERD (gastroesophageal reflux disease)    states he had previous EGD  . Gout   . History of bronchitis   . HTN (hypertension)   . Hx of adenomatous colonic polyps   . Hyperlipidemia   . Hypertriglyceridemia   . Hypothyroidism   . Seasonal allergies   . Shoulder impingement, left   . Wears glasses     Past Surgical History:  Procedure Laterality Date  . BILATERAL CARPAL TUNNEL RELEASE Bilateral 02/12/2014   Procedure: BILATERAL CARPAL TUNNEL RELEASE;  Surgeon: Daryll Brod, MD;  Location: [place];  Service: Orthopedics;  Laterality: Bilateral;  . CHOLECYSTECTOMY    . COLONOSCOPY  2006   adenomatous polyp, diverticulosis  . COLONOSCOPY  09/29/2010   Procedure: COLONOSCOPY;  Surgeon: Daneil Dolin, MD;  [facility];  Service: Endoscopy;  Laterality: N/A;  . COLONOSCOPY WITH PROPOFOL N/A 10/24/2017   Procedure: COLONOSCOPY WITH PROPOFOL;  Surgeon: Daneil Dolin, MD;  [facility];  Service: Endoscopy;  Laterality: N/A;  11:00am  . NECK SURGERY  2001   cerv fusion  . POLYPECTOMY  10/24/2017   Procedure: POLYPECTOMY;  Surgeon: Daneil Dolin, MD;  [facility];  Service: Endoscopy;;  splenic flexure and hepatic flexure, descending colon  . ROTATOR CUFF  REPAIR  2006   righrt  . SHOULDER ARTHROSCOPY WITH SUBACROMIAL DECOMPRESSION, ROTATOR CUFF REPAIR AND BICEP TENDON REPAIR Left 01/20/2019   Procedure: left shoulder arthroscopy, subacromial decompression, distal clavicle resection, mini open rotator cuff repair;  Surgeon: Garald Balding, MD;  Location: [place];  Service: Orthopedics;  Laterality: Left;  . TONSILLECTOMY      There were no vitals filed for this visit.  Subjective Assessment - 04/02/19 1223    Subjective  My Lt shoulder was really sore after last session, it has eased off today    Pertinent History  Rt RTC repair    Limitations  Lifting    Patient Stated Goals  improve function and strength    Currently in Pain?  No/denies    Pain Onset  1 to 4 weeks ago                       [facility] Adult PT Treatment/Exercise - 04/02/19 0001      Shoulder Exercises: Standing   External Rotation  Left;15 reps    Theraband Level (Shoulder External Rotation)  Level 3 (Green)    Internal Rotation  Left;15 reps    Theraband Level (Shoulder Internal Rotation)  Level 3 (Green)    Extension  20 reps    Theraband Level (Shoulder Extension)  Level 3 (  Green)    Row  20 reps    Theraband Level (Shoulder Row)  Level 3 (Green)    Other Standing Exercises  OH shoulder stability rolls with 2 lb ball X 10 sup-inf, lateral, circles    Other Standing Exercises  bent over row 15 lb KB X 15 reps, farmers carry 15 lb KB for two laps holding in Lt hand. Bent over shoulder extensions 2 lb ball X 15 reps ea      Shoulder Exercises: Pulleys   Flexion  2 minutes    Scaption  2 minutes      Shoulder Exercises: ROM/Strengthening   UBE (Upper Arm Bike)  2 min fwd, 2 min retro, L4    [place]  X10 reps flexion, then cirlcles at 90 deg flexion CW and CCW, then scaption      Manual Therapy   Passive ROM   he declined, feels he does not need it               PT Short Term Goals - 02/18/19 1100      PT SHORT TERM  GOAL #1   Title  independent with initial HEP    Status  Achieved    Target Date  03/11/19      PT SHORT TERM GOAL #2   Title  demonstrate improved PROM Lt shoulder by at least 20 degrees flexion, abduction, external rotation for improved function    Status  Achieved    Target Date  03/11/19        PT Long Term Goals - 03/24/19 1237      PT LONG TERM GOAL #1   Title  independent with advanced HEP    Status  On-going      PT LONG TERM GOAL #2   Title  demonstrate Lt shoulder AROM at least 75% of Rt shoulder for improved function    Status  Achieved      PT LONG TERM GOAL #3   Title  report pain < 4/10 with activity for improved function    Status  On-going      PT LONG TERM GOAL #4   Title  strengthening program initated and progressed in order to progress strength    Status  Achieved            Plan - 04/02/19 1226    Clinical Impression Statement  He was sore after strength progression last visit so backed down a little today with good tolerance. PT will still plan for discharge at end of next week as he should meet his goals by then and he feels pleased with his funcitonal status.    Examination-Activity Limitations  Bathing;Bed Mobility;Toileting;Reach Overhead;Self Feeding;Carry;Sleep;Hygiene/Grooming;Dressing    Examination-Participation Restrictions  Other;Driving    Rehab Potential  Good    PT Frequency  3x / week    PT Duration  12 weeks    PT Treatment/Interventions  ADLs/Self Care Home Management;Cryotherapy;Electrical Stimulation;Ultrasound;Moist Heat;Therapeutic exercise;Patient/family education;Manual techniques;Scar mobilization;Vasopneumatic Device;Taping;Passive range of motion;Dry needling    PT Next Visit Plan  progress AROM, light strengthening and measure    PT Home Exercise Plan  Access Code: [email or web address]    Consulted and Agree with Plan of Care  Patient       Patient will benefit from skilled therapeutic intervention in order to improve the  following deficits and impairments:  Pain, Postural dysfunction, Increased edema, Impaired UE functional use, Decreased strength, Decreased range of motion  Visit Diagnosis: Acute pain of left shoulder  Stiffness of left shoulder, not elsewhere classified  Abnormal posture  Localized edema     Problem List Patient Active Problem List   Diagnosis Date Noted  . S/P left rotator cuff repair 01/27/2019  . Pain in left shoulder 11/28/2018  . Encounter for screening colonoscopy 08/14/2017  . Alcohol abuse, daily use 08/14/2017  . GERD (gastroesophageal reflux disease) 08/29/2010  . Hx of adenomatous colonic polyps 08/29/2010  . FHx: colon cancer 08/29/2010    Silvestre Mesi 04/02/2019, 12:27 PM  [facility] [address] [place], [place], [address] Phone: [phone]   Fax:  [phone]  Name: Louis Mccullough MRN: [id number] Date of Birth: 07-06-1955

## 2019-04-07 ENCOUNTER — Ambulatory Visit (INDEPENDENT_AMBULATORY_CARE_PROVIDER_SITE_OTHER): Payer: No Typology Code available for payment source | Admitting: Physical Therapy

## 2019-04-07 ENCOUNTER — Other Ambulatory Visit: Payer: Self-pay

## 2019-04-07 ENCOUNTER — Encounter: Payer: Self-pay | Admitting: Physical Therapy

## 2019-04-07 DIAGNOSIS — R293 Abnormal posture: Secondary | ICD-10-CM

## 2019-04-07 DIAGNOSIS — M25512 Pain in left shoulder: Secondary | ICD-10-CM

## 2019-04-07 DIAGNOSIS — M25612 Stiffness of left shoulder, not elsewhere classified: Secondary | ICD-10-CM

## 2019-04-07 DIAGNOSIS — R600 Localized edema: Secondary | ICD-10-CM

## 2019-04-07 NOTE — Patient Instructions (Signed)
Access Code: [email or web address]  URL: [email or web address]  Date: 04/07/2019  Prepared by: Faustino Congress   Exercises Standing Shoulder Internal Rotation Stretch with Towel - 5 reps - 1 sets - 20 sec hold - 2x daily - 7x weekly Standing Row with Anchored Resistance - 15 reps - 3 sets - 5 sec hold - 1x daily - 7x weekly Shoulder extension with resistance - Neutral - 15 reps - 1 sets - 5 sec hold - 1x daily - 7x weekly Shoulder External Rotation with Anchored Resistance - 15 reps - 1 sets - 1x daily - 7x weekly Single Arm Shoulder Flexion with Dumbbell - 15 reps - 1 sets - 1x daily - 7x weekly Standing Single Arm Shoulder Abduction with Dumbbell - Thumb Up - 15 reps - 1 sets - 1x daily - 7x weekly

## 2019-04-07 NOTE — Therapy (Signed)
[facility] [address] [place], [place], [address] Phone: [phone]   Fax:  [phone]  Physical Therapy Treatment/Discharge Summary  Patient Details  Name: KINNEY SACKMANN MRN: [id number] Date of Birth: 1955-03-14 Referring Provider (PT): Cherylann Ratel, [place]   Encounter Date: 04/07/2019  PT End of Session - 04/07/19 1242    Visit Number  18    Date for PT Re-Evaluation  04/22/19    PT Start Time  1145    PT Stop Time  1215    PT Time Calculation (min)  30 min    Activity Tolerance  Patient tolerated treatment well    Behavior During Therapy  [facility] for tasks assessed/performed       Past Medical History:  Diagnosis Date  . GERD (gastroesophageal reflux disease)    states he had previous EGD  . Gout   . History of bronchitis   . HTN (hypertension)   . Hx of adenomatous colonic polyps   . Hyperlipidemia   . Hypertriglyceridemia   . Hypothyroidism   . Seasonal allergies   . Shoulder impingement, left   . Wears glasses     Past Surgical History:  Procedure Laterality Date  . BILATERAL CARPAL TUNNEL RELEASE Bilateral 02/12/2014   Procedure: BILATERAL CARPAL TUNNEL RELEASE;  Surgeon: Daryll Brod, MD;  Location: [place];  Service: Orthopedics;  Laterality: Bilateral;  . CHOLECYSTECTOMY    . COLONOSCOPY  2006   adenomatous polyp, diverticulosis  . COLONOSCOPY  09/29/2010   Procedure: COLONOSCOPY;  Surgeon: Daneil Dolin, MD;  [facility];  Service: Endoscopy;  Laterality: N/A;  . COLONOSCOPY WITH PROPOFOL N/A 10/24/2017   Procedure: COLONOSCOPY WITH PROPOFOL;  Surgeon: Daneil Dolin, MD;  [facility];  Service: Endoscopy;  Laterality: N/A;  11:00am  . NECK SURGERY  2001   cerv fusion  . POLYPECTOMY  10/24/2017   Procedure: POLYPECTOMY;  Surgeon: Daneil Dolin, MD;  [facility];  Service: Endoscopy;;  splenic flexure and hepatic flexure, descending colon  . ROTATOR CUFF REPAIR   2006   righrt  . SHOULDER ARTHROSCOPY WITH SUBACROMIAL DECOMPRESSION, ROTATOR CUFF REPAIR AND BICEP TENDON REPAIR Left 01/20/2019   Procedure: left shoulder arthroscopy, subacromial decompression, distal clavicle resection, mini open rotator cuff repair;  Surgeon: Garald Balding, MD;  Location: [place];  Service: Orthopedics;  Laterality: Left;  . TONSILLECTOMY      There were no vitals filed for this visit.  Subjective Assessment - 04/07/19 1144    Subjective  wants today to be his last day - doing well.    Pertinent History  Rt RTC repair    Limitations  Lifting    Patient Stated Goals  improve function and strength    Currently in Pain?  No/denies    Pain Onset  1 to 4 weeks ago         [facility] PT Assessment - 04/07/19 1243      Assessment   Medical Diagnosis  Lt RTC repair    Referring Provider (PT)  Cherylann Ratel, PA-C    Onset Date/Surgical Date  01/20/19      Strength   Overall Strength Comments  Lt shoulder flexion, abd, and ER 4+/5, IR 5/5                   [facility] Adult PT Treatment/Exercise - 04/07/19 1146      Shoulder Exercises: Standing   External  Rotation  Left;15 reps    Theraband Level (Shoulder External Rotation)  Level 3 (Green)    Internal Rotation  Left;15 reps    Theraband Level (Shoulder Internal Rotation)  Other (comment)   L5   Flexion  Left;15 reps    Shoulder Flexion Weight (lbs)  2    ABduction  Left;15 reps    Shoulder ABduction Weight (lbs)  2    Extension  15 reps;Theraband    Theraband Level (Shoulder Extension)  Other (comment)   L5   Row  15 reps;Theraband    Theraband Level (Shoulder Row)  Other (comment)   L5     Shoulder Exercises: ROM/Strengthening   UBE (Upper Arm Bike)  2 min fwd, 2 min retro, L5      Shoulder Exercises: Stretch   Internal Rotation Stretch  5 reps   20 sec            PT Education - 04/07/19 1241    Education Details  HEP    Person(s) Educated  Patient     Methods  Explanation;Demonstration;Handout    Comprehension  Verbalized understanding       PT Short Term Goals - 02/18/19 1100      PT SHORT TERM GOAL #1   Title  independent with initial HEP    Status  Achieved    Target Date  03/11/19      PT SHORT TERM GOAL #2   Title  demonstrate improved PROM Lt shoulder by at least 20 degrees flexion, abduction, external rotation for improved function    Status  Achieved    Target Date  03/11/19        PT Long Term Goals - 04/07/19 1242      PT LONG TERM GOAL #1   Title  independent with advanced HEP    Status  Achieved      PT LONG TERM GOAL #2   Title  demonstrate Lt shoulder AROM at least 75% of Rt shoulder for improved function    Status  Achieved      PT LONG TERM GOAL #3   Title  report pain < 4/10 with activity for improved function    Status  Achieved      PT LONG TERM GOAL #4   Title  strengthening program initated and progressed in order to progress strength    Status  Achieved            Plan - 04/07/19 1243    Clinical Impression Statement  Pt has met all goals and is ready for d/c at this time.  Final HEP provided to pt today to continue to progress strengthening that is still slightly limited.    Examination-Activity Limitations  Bathing;Bed Mobility;Toileting;Reach Overhead;Self Feeding;Carry;Sleep;Hygiene/Grooming;Dressing    Examination-Participation Restrictions  Other;Driving    Rehab Potential  Good    PT Frequency  3x / week    PT Duration  12 weeks    PT Treatment/Interventions  ADLs/Self Care Home Management;Cryotherapy;Electrical Stimulation;Ultrasound;Moist Heat;Therapeutic exercise;Patient/family education;Manual techniques;Scar mobilization;Vasopneumatic Device;Taping;Passive range of motion;Dry needling    PT Next Visit Plan  d/c PT today    PT Home Exercise Plan  Access Code: [place] and Agree with Plan of Care  Patient       Patient will benefit from skilled therapeutic  intervention in order to improve the following deficits and impairments:  Pain, Postural dysfunction, Increased edema, Impaired UE functional use, Decreased strength, Decreased range of motion  Visit  Diagnosis: Acute pain of left shoulder  Stiffness of left shoulder, not elsewhere classified  Abnormal posture  Localized edema     Problem List Patient Active Problem List   Diagnosis Date Noted  . S/P left rotator cuff repair 01/27/2019  . Pain in left shoulder 11/28/2018  . Encounter for screening colonoscopy 08/14/2017  . Alcohol abuse, daily use 08/14/2017  . GERD (gastroesophageal reflux disease) 08/29/2010  . Hx of adenomatous colonic polyps 08/29/2010  . FHx: colon cancer 08/29/2010        Laureen Abrahams, PT, DPT 04/07/19 12:46 PM    [facility] [address] [place], [place], [address] Phone: [phone]   Fax:  [phone]  Name: LIONARDO HAZE MRN: [id number] Date of Birth: [birth year]       PHYSICAL THERAPY DISCHARGE SUMMARY  Visits from Start of Care: 18  Current functional level related to goals / functional outcomes: See above   Remaining deficits: See above   Education / Equipment: HEP  Plan: Patient agrees to discharge.  Patient goals were met. Patient is being discharged due to meeting the stated rehab goals.  ?????    Laureen Abrahams, PT, DPT 04/07/19 12:47 PM    [facility] [address] [place], [place], [address] Phone: [phone]   Fax:  [phone]

## 2019-04-09 ENCOUNTER — Encounter: Payer: No Typology Code available for payment source | Admitting: Physical Therapy

## 2019-06-22 ENCOUNTER — Other Ambulatory Visit (HOSPITAL_COMMUNITY): Payer: Self-pay | Admitting: Internal Medicine

## 2019-06-22 MED FILL — DICLOFENAC SOD EC 50 MG TAB: 50 | 30 days supply | Qty: 90 | Fill #3

## 2019-06-22 MED FILL — HYDROCHLOROTHIAZIDE 25 MG T: 25 | 90 days supply | Qty: 90 | Fill #0

## 2019-06-22 MED FILL — SIMVASTATIN 20 MG TABLET: 20 | 90 days supply | Qty: 90 | Fill #0

## 2019-06-22 MED FILL — TELMISARTAN 80 MG TABLET: 80 | 90 days supply | Qty: 90 | Fill #0

## 2019-06-22 MED FILL — METHOCARBAMOL 500 MG TABS: 500 | 30 days supply | Qty: 90 | Fill #1

## 2019-06-22 MED FILL — ALLOPURINOL 300 MG TABS: 300 | 90 days supply | Qty: 90 | Fill #0

## 2019-06-22 MED FILL — LEVOTHYROXINE SODIUM 112 MC: 112 | 90 days supply | Qty: 90 | Fill #0

## 2019-06-22 MED FILL — PANTOPRAZOLE SOD DR 40 MG T: 40 | 90 days supply | Qty: 90 | Fill #0

## 2019-06-22 MED FILL — AMLODIPINE BESYLATE 10 MG T: 10 | 90 days supply | Qty: 90 | Fill #0

## 2019-09-21 ENCOUNTER — Other Ambulatory Visit (HOSPITAL_COMMUNITY): Payer: Self-pay | Admitting: Internal Medicine

## 2019-09-21 MED FILL — AMLODIPINE BESYLATE 10 MG T: 10 | 90 days supply | Qty: 90 | Fill #1

## 2019-09-21 MED FILL — TELMISARTAN 80 MG TABS: 80 | 90 days supply | Qty: 90 | Fill #1

## 2019-09-21 MED FILL — ALLOPURINOL 300 MG TABS: 300 | 90 days supply | Qty: 90 | Fill #1

## 2019-09-21 MED FILL — LEVOTHYROXINE SODIUM 112 MC: 112 | 90 days supply | Qty: 90 | Fill #1

## 2019-09-21 MED FILL — PANTOPRAZOLE SOD DR 40 MG T: 40 | 90 days supply | Qty: 90 | Fill #1

## 2019-09-21 MED FILL — SIMVASTATIN 20 MG TABLET: 20 | 90 days supply | Qty: 90 | Fill #1

## 2019-09-21 MED FILL — HYDROCHLOROTHIAZIDE 25 MG T: 25 | 90 days supply | Qty: 90 | Fill #1

## 2019-09-21 MED FILL — FLUTICASONE PROP 50 MCG SPR: 50 | 90 days supply | Qty: 48 | Fill #0

## 2019-12-16 ENCOUNTER — Other Ambulatory Visit (HOSPITAL_COMMUNITY): Payer: Self-pay | Admitting: Internal Medicine

## 2019-12-16 MED FILL — FLUAD QUADRIVALENT 0.5 ML P: 0.5 | 1 days supply | Qty: 1 | Fill #0

## 2019-12-21 MED FILL — LEVOTHYROXINE SODIUM 112 MC: 112 | 90 days supply | Qty: 90 | Fill #2

## 2019-12-21 MED FILL — FLUTICASONE PROP 50 MCG SPR: 50 | 90 days supply | Qty: 48 | Fill #1

## 2019-12-21 MED FILL — SIMVASTATIN 20 MG TABLET: 20 | 90 days supply | Qty: 90 | Fill #2

## 2019-12-21 MED FILL — AMLODIPINE BESYLATE 10 MG T: 10 | 90 days supply | Qty: 90 | Fill #2

## 2019-12-21 MED FILL — ALLOPURINOL 300 MG TABS: 300 | 90 days supply | Qty: 90 | Fill #2

## 2019-12-21 MED FILL — PANTOPRAZOLE SOD DR 40 MG T: 40 | 90 days supply | Qty: 90 | Fill #2

## 2019-12-21 MED FILL — HYDROCHLOROTHIAZIDE 25 MG T: 25 | 90 days supply | Qty: 90 | Fill #2

## 2019-12-21 MED FILL — TELMISARTAN 80 MG TABS: 80 | 90 days supply | Qty: 90 | Fill #2

## 2020-03-11 ENCOUNTER — Other Ambulatory Visit (HOSPITAL_COMMUNITY): Payer: Self-pay | Admitting: Internal Medicine

## 2020-03-11 MED FILL — KETOCONAZOLE 2% CREAM: 2 | 21 days supply | Qty: 30 | Fill #0

## 2020-03-21 MED FILL — HYDROCHLOROTHIAZIDE 25 MG T: 25 | 90 days supply | Qty: 90 | Fill #3

## 2020-03-21 MED FILL — LEVOTHYROXINE SODIUM 112 MC: 112 | 90 days supply | Qty: 90 | Fill #3

## 2020-03-21 MED FILL — AMLODIPINE BESYLATE 10 MG T: 10 | 90 days supply | Qty: 90 | Fill #3

## 2020-03-21 MED FILL — PANTOPRAZOLE SOD DR 40 MG T: 40 | 90 days supply | Qty: 90 | Fill #3

## 2020-03-21 MED FILL — ALLOPURINOL 300 MG TABS: 300 | 90 days supply | Qty: 90 | Fill #3

## 2020-03-21 MED FILL — TELMISARTAN 80 MG TABS: 80 | 90 days supply | Qty: 90 | Fill #3

## 2020-03-21 MED FILL — SIMVASTATIN 20 MG TABLET: 20 | 90 days supply | Qty: 90 | Fill #3

## 2020-06-14 ENCOUNTER — Other Ambulatory Visit (HOSPITAL_COMMUNITY): Payer: Self-pay

## 2020-06-14 MED FILL — Hydrochlorothiazide Tab 25 MG: ORAL | 90 days supply | Qty: 90 | Fill #0 | Status: AC

## 2020-06-14 MED FILL — Levothyroxine Sodium Tab 112 MCG: ORAL | 90 days supply | Qty: 90 | Fill #0 | Status: AC

## 2020-06-14 MED FILL — Ketoconazole Cream 2%: CUTANEOUS | 30 days supply | Qty: 30 | Fill #0 | Status: CN

## 2020-06-14 MED FILL — Ketoconazole Cream 2%: CUTANEOUS | 30 days supply | Qty: 30 | Fill #0 | Status: AC

## 2020-06-14 MED FILL — Telmisartan Tab 80 MG: ORAL | 90 days supply | Qty: 90 | Fill #0 | Status: AC

## 2020-06-14 MED FILL — Amlodipine Besylate Tab 10 MG (Base Equivalent): ORAL | 90 days supply | Qty: 90 | Fill #0 | Status: AC

## 2020-06-14 MED FILL — Fluticasone Propionate Nasal Susp 50 MCG/ACT: NASAL | 90 days supply | Qty: 48 | Fill #0 | Status: AC

## 2020-06-14 MED FILL — Allopurinol Tab 300 MG: ORAL | 90 days supply | Qty: 90 | Fill #0 | Status: AC

## 2020-06-14 MED FILL — Pantoprazole Sodium EC Tab 40 MG (Base Equiv): ORAL | 90 days supply | Qty: 90 | Fill #0 | Status: AC

## 2020-06-14 MED FILL — Simvastatin Tab 20 MG: ORAL | 90 days supply | Qty: 90 | Fill #0 | Status: AC

## 2020-06-15 ENCOUNTER — Other Ambulatory Visit (HOSPITAL_COMMUNITY): Payer: Self-pay

## 2020-06-16 ENCOUNTER — Other Ambulatory Visit (HOSPITAL_COMMUNITY): Payer: Self-pay

## 2020-09-07 ENCOUNTER — Other Ambulatory Visit (HOSPITAL_COMMUNITY): Payer: Self-pay

## 2020-09-07 MED ORDER — METFORMIN HCL 500 MG PO TABS
500.0000 mg | ORAL_TABLET | Freq: Two times a day (BID) | ORAL | 4 refills | Status: DC
  Filled 2020-09-07: qty 180, 90d supply, fill #0

## 2020-09-13 ENCOUNTER — Other Ambulatory Visit (HOSPITAL_COMMUNITY): Payer: Self-pay

## 2020-09-13 MED ORDER — LEVOTHYROXINE SODIUM 112 MCG PO TABS
112.0000 ug | ORAL_TABLET | Freq: Every day | ORAL | 4 refills | Status: DC
  Filled 2020-09-13: qty 90, 90d supply, fill #0
  Filled 2020-12-13: qty 90, 90d supply, fill #1
  Filled 2021-03-14: qty 90, 90d supply, fill #2
  Filled 2021-06-12: qty 90, 90d supply, fill #3
  Filled 2021-09-07: qty 90, 90d supply, fill #4

## 2020-09-13 MED ORDER — AMLODIPINE BESYLATE 10 MG PO TABS
10.0000 mg | ORAL_TABLET | Freq: Every day | ORAL | 4 refills | Status: DC
  Filled 2020-09-13: qty 90, 90d supply, fill #0
  Filled 2020-12-13: qty 90, 90d supply, fill #1
  Filled 2021-03-14: qty 90, 90d supply, fill #2
  Filled 2021-06-12: qty 90, 90d supply, fill #3
  Filled 2021-09-07: qty 90, 90d supply, fill #4

## 2020-09-13 MED ORDER — PANTOPRAZOLE SODIUM 40 MG PO TBEC
40.0000 mg | DELAYED_RELEASE_TABLET | Freq: Every day | ORAL | 4 refills | Status: DC
  Filled 2020-09-13: qty 90, 90d supply, fill #0
  Filled 2020-12-13: qty 90, 90d supply, fill #1
  Filled 2021-03-14: qty 90, 90d supply, fill #2
  Filled 2021-06-12: qty 90, 90d supply, fill #3
  Filled 2021-09-07: qty 90, 90d supply, fill #4

## 2020-09-13 MED ORDER — ALLOPURINOL 300 MG PO TABS
300.0000 mg | ORAL_TABLET | Freq: Every day | ORAL | 4 refills | Status: DC
  Filled 2020-09-13: qty 90, 90d supply, fill #0
  Filled 2020-12-13: qty 90, 90d supply, fill #1
  Filled 2021-03-14: qty 90, 90d supply, fill #2
  Filled 2021-06-12: qty 90, 90d supply, fill #3
  Filled 2021-09-07: qty 90, 90d supply, fill #4

## 2020-09-13 MED ORDER — HYDROCHLOROTHIAZIDE 25 MG PO TABS
25.0000 mg | ORAL_TABLET | Freq: Every day | ORAL | 4 refills | Status: DC
  Filled 2020-09-13: qty 90, 90d supply, fill #0
  Filled 2020-12-13: qty 90, 90d supply, fill #1
  Filled 2021-03-14: qty 90, 90d supply, fill #2
  Filled 2021-06-12: qty 90, 90d supply, fill #3
  Filled 2021-09-07: qty 90, 90d supply, fill #4

## 2020-09-14 ENCOUNTER — Other Ambulatory Visit (HOSPITAL_COMMUNITY): Payer: Self-pay

## 2020-09-14 MED ORDER — SIMVASTATIN 20 MG PO TABS
20.0000 mg | ORAL_TABLET | Freq: Every day | ORAL | 4 refills | Status: DC
  Filled 2020-09-14: qty 90, 90d supply, fill #0
  Filled 2020-12-13: qty 90, 90d supply, fill #1
  Filled 2021-03-14: qty 90, 90d supply, fill #2
  Filled 2021-06-12: qty 90, 90d supply, fill #3
  Filled 2021-09-07: qty 90, 90d supply, fill #4

## 2020-09-14 MED ORDER — TELMISARTAN 80 MG PO TABS
80.0000 mg | ORAL_TABLET | Freq: Every day | ORAL | 4 refills | Status: DC
  Filled 2020-09-14: qty 90, 90d supply, fill #0
  Filled 2020-12-13: qty 90, 90d supply, fill #1
  Filled 2021-03-14: qty 90, 90d supply, fill #2
  Filled 2021-06-12: qty 90, 90d supply, fill #3
  Filled 2021-09-07: qty 90, 90d supply, fill #4

## 2020-10-03 ENCOUNTER — Encounter: Payer: Self-pay | Admitting: *Deleted

## 2020-12-13 ENCOUNTER — Other Ambulatory Visit (HOSPITAL_COMMUNITY): Payer: Self-pay

## 2020-12-13 MED ORDER — FLUTICASONE PROPIONATE 50 MCG/ACT NA SUSP
2.0000 | Freq: Every day | NASAL | 4 refills | Status: DC
  Filled 2020-12-13: qty 48, 90d supply, fill #0

## 2020-12-14 ENCOUNTER — Other Ambulatory Visit (HOSPITAL_COMMUNITY): Payer: Self-pay

## 2020-12-14 MED ORDER — FLUAD QUADRIVALENT 0.5 ML IM PRSY
0.5000 mL | PREFILLED_SYRINGE | Freq: Once | INTRAMUSCULAR | 0 refills | Status: AC
  Filled 2020-12-14: qty 0.5, 1d supply, fill #0

## 2020-12-20 ENCOUNTER — Other Ambulatory Visit (HOSPITAL_COMMUNITY): Payer: Self-pay

## 2020-12-21 ENCOUNTER — Other Ambulatory Visit (HOSPITAL_COMMUNITY): Payer: Self-pay

## 2021-02-23 IMAGING — MR MR SHOULDER*L* W/O CM
4 of 5 series · 19 of 40 positions shown · non-contrast
Comparison: None.

CLINICAL DATA: Chronic left shoulder pain.  No known injury.

EXAM:
MRI OF THE LEFT SHOULDER WITHOUT CONTRAST
TECHNIQUE: Multiplanar, multisequence MR imaging of the shoulder was performed.
No intravenous contrast was administered.

[Series 3: PD fat-sat · axial · 4.0mm · 0.28mm/px · z∈[-60,+28]mm · 8 of 20 slices shown (1 of 2)]
[im 1/20]
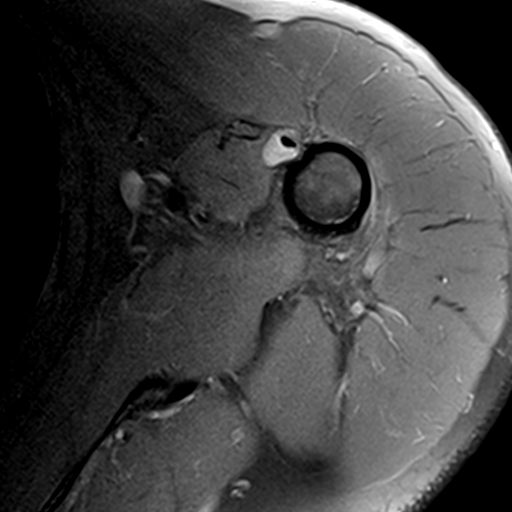
[im 3/20]
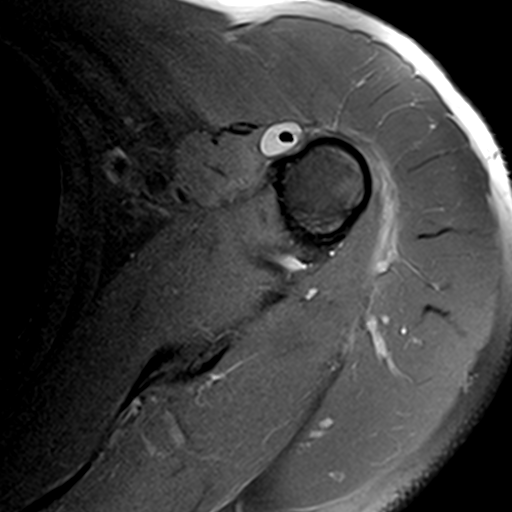
[im 6/20]
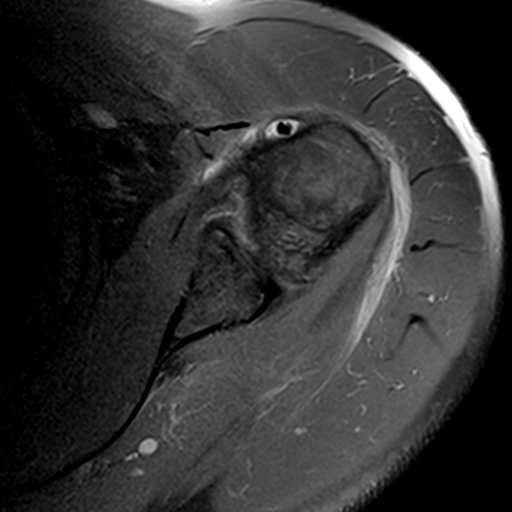
[im 9/20]
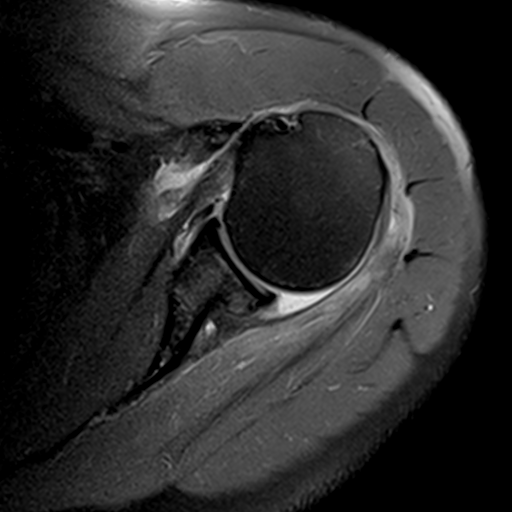
[im 11/20]
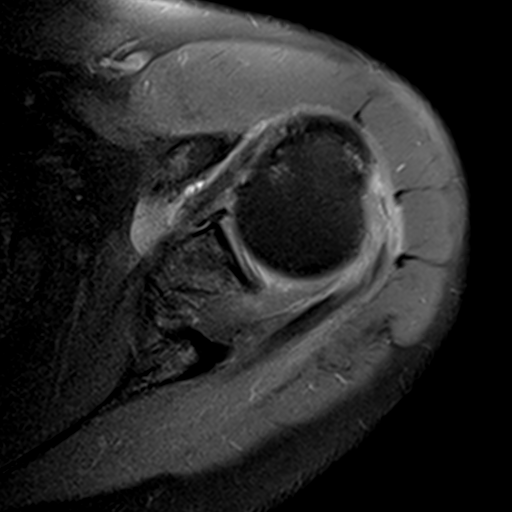
[im 14/20]
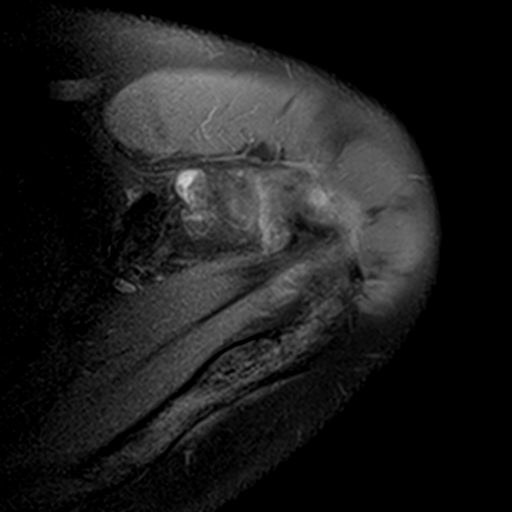
[im 17/20]
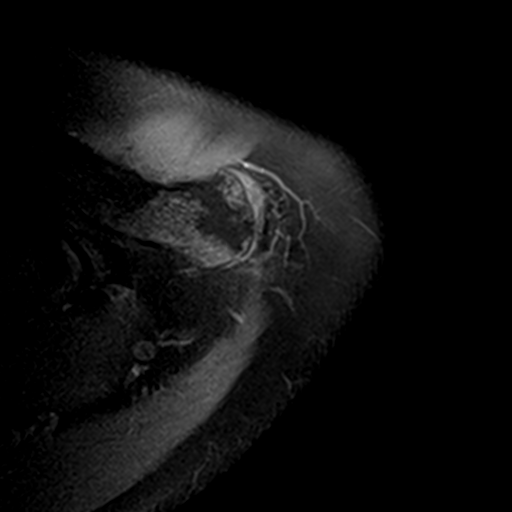
[im 20/20]
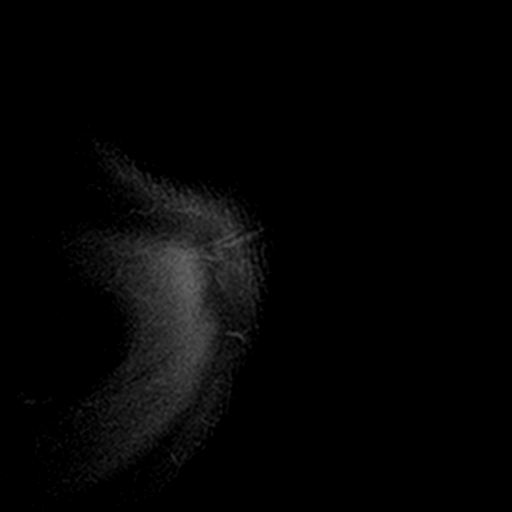

[Series 5: T2 fat-sat · oblique · 4.0mm · 0.25mm/px · 3 of 24 slices shown (1 of 2)]
[im 3/24]
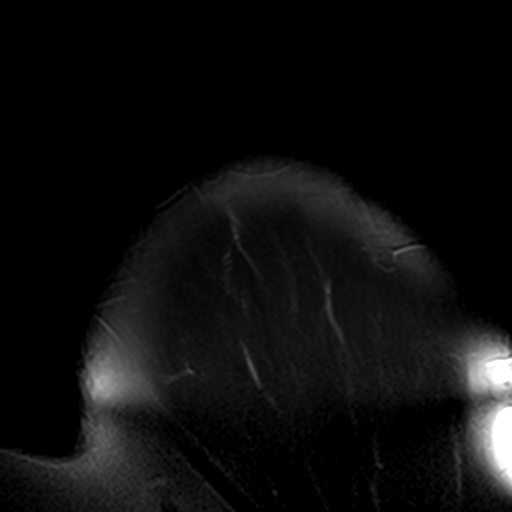
[im 12/24]
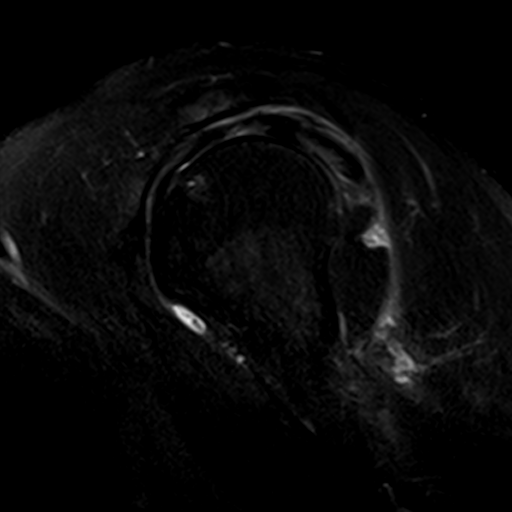
[im 21/24]
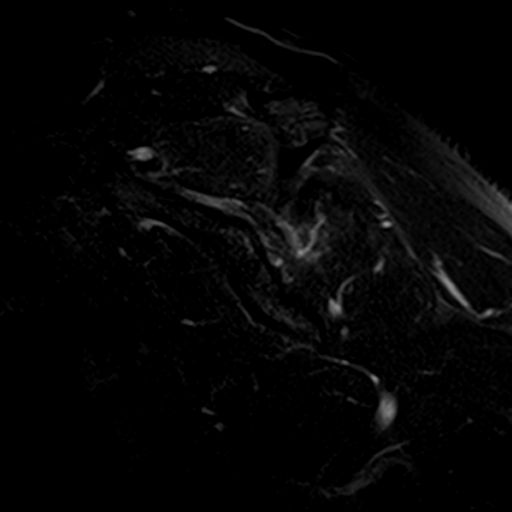

[Series 6: T2 fat-sat · oblique · 4.0mm · 0.26mm/px · 3 of 20 slices shown (2 of 2)]
[im 4/20]
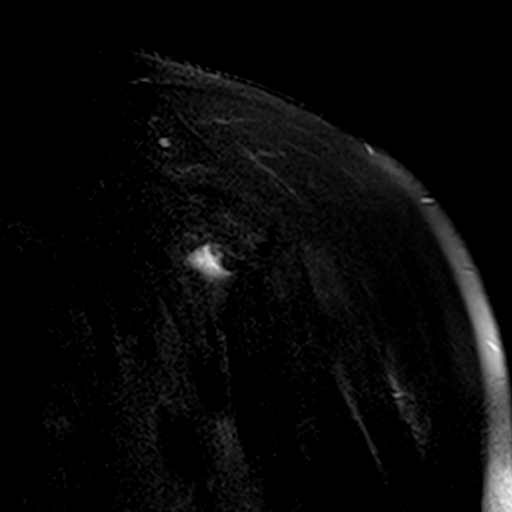
[im 10/20]
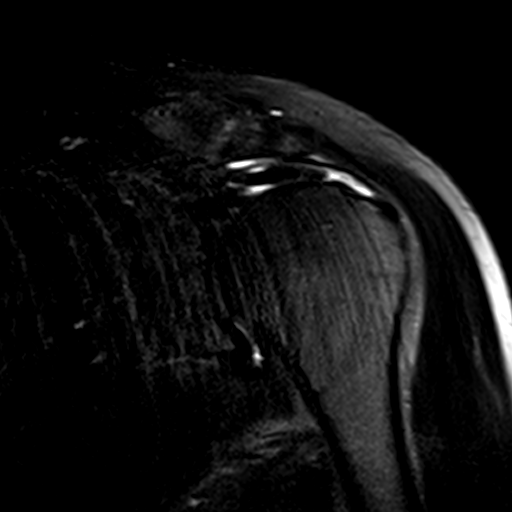
[im 16/20]
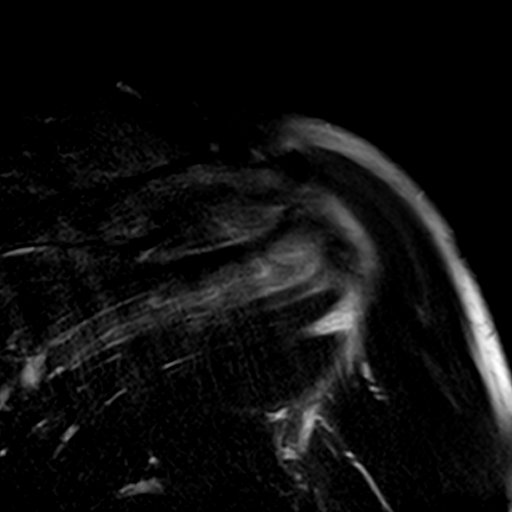

[Series 7: PD fat-sat · oblique · 4.0mm · 0.26mm/px · 5 of 20 slices shown (2 of 2)]
[im 1/20]
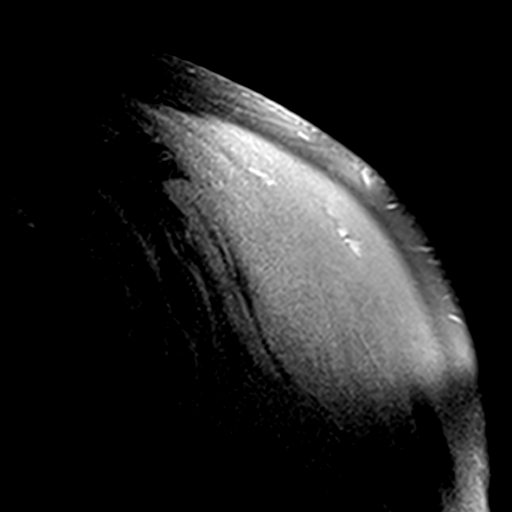
[im 4/20]
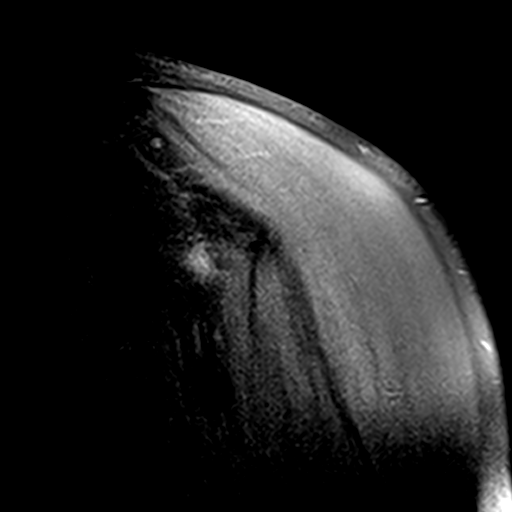
[im 7/20]
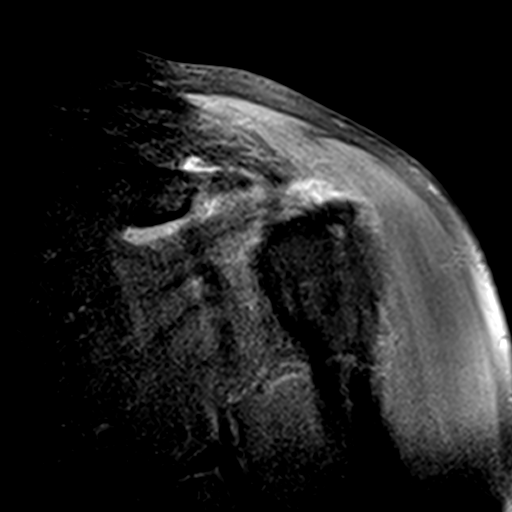
[im 10/20]
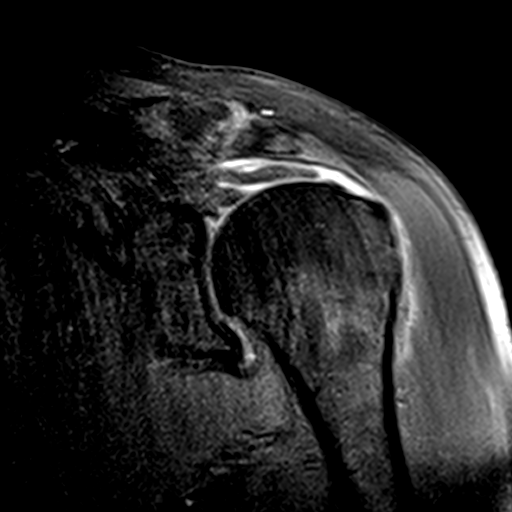
[im 16/20]
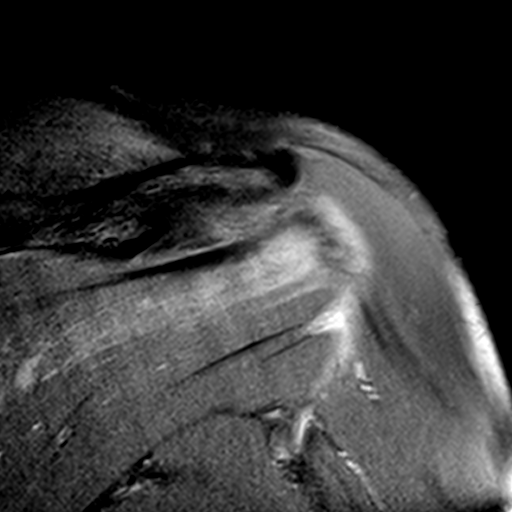

[19 of 40 positions shown; findings below may reference images not displayed]

FINDINGS: Rotator cuff: The patient has marked rotator cuff tendinopathy.
There are complete supraspinatus and infraspinatus tendon tears with
retraction to the top of the humeral head, 1.5-2.5 cm. The
subscapularis and teres minor are intact.

Muscles:  No atrophy or focal lesion.

Biceps long head:  Intact.

Acromioclavicular Joint: Moderate to moderately severe
osteoarthritis. Type 2 acromion. Fluid is seen in the
subacromial/subdeltoid bursa.

Glenohumeral Joint: Negative.

Labrum:  Intact.

Bones:  No fracture or focal lesion.

Other: None.
IMPRESSION: Complete supraspinatus and infraspinatus tendon tears with 1.5-2.5
cm of retraction. No atrophy.

Moderate to moderately severe acromioclavicular osteoarthritis.

Subacromial/subdeltoid bursitis.

## 2021-03-14 ENCOUNTER — Other Ambulatory Visit (HOSPITAL_COMMUNITY): Payer: Self-pay

## 2021-03-21 ENCOUNTER — Encounter: Payer: Self-pay | Admitting: Internal Medicine

## 2021-03-21 ENCOUNTER — Ambulatory Visit: Payer: No Typology Code available for payment source | Admitting: Internal Medicine

## 2021-03-21 ENCOUNTER — Other Ambulatory Visit: Payer: Self-pay

## 2021-03-21 VITALS — BP 147/87 | HR 81 | Temp 97.1°F | Ht 69.0 in | Wt 223.4 lb

## 2021-03-21 DIAGNOSIS — Z86010 Personal history of colonic polyps: Secondary | ICD-10-CM

## 2021-03-21 NOTE — Patient Instructions (Signed)
It was good seeing you again today!  As discussed, we will schedule a surveillance colonoscopy (history of colon polyps) ASA 3/propofol in the near future.  Further recommendations to follow after colonoscopy is been performed.

## 2021-03-21 NOTE — Progress Notes (Signed)
Primary Care Physician:  Asencion Noble, MD Primary Gastroenterologist:  Dr. Gala Romney  Pre-Procedure History & Physical: HPI:  Louis Mccullough is a 66 y.o. male here for for consideration of setting up a surveillance colonoscopy.  Patient has multiple colonic adenomas removed over multiple colonoscopies.  Last colonoscopy 10/24/2017 yielded multiple colonic adenomas with the largest being 11 mm polyp in the descending segment and pancolonic diverticulosis.  Currently, patient has no lower GI tract symptoms.  GERD well-controlled on Protonix 40 mg once daily.  Past Medical History:  Diagnosis Date   GERD (gastroesophageal reflux disease)    states he had previous EGD   Gout    History of bronchitis    HTN (hypertension)    Hx of adenomatous colonic polyps    Hyperlipidemia    Hypertriglyceridemia    Hypothyroidism    Seasonal allergies    Shoulder impingement, left    Wears glasses     Past Surgical History:  Procedure Laterality Date   BILATERAL CARPAL TUNNEL RELEASE Bilateral 02/12/2014   Procedure: BILATERAL CARPAL TUNNEL RELEASE;  Surgeon: Daryll Brod, MD;  Location: [place];  Service: Orthopedics;  Laterality: Bilateral;   CHOLECYSTECTOMY     COLONOSCOPY  2006   adenomatous polyp, diverticulosis   COLONOSCOPY  09/29/2010   Procedure: COLONOSCOPY;  Surgeon: Daneil Dolin, MD;  [facility];  Service: Endoscopy;  Laterality: N/A;   COLONOSCOPY WITH PROPOFOL N/A 10/24/2017   Procedure: COLONOSCOPY WITH PROPOFOL;  Surgeon: Daneil Dolin, MD;  [facility];  Service: Endoscopy;  Laterality: N/A;  11:00am   NECK SURGERY  2001   cerv fusion   POLYPECTOMY  10/24/2017   Procedure: POLYPECTOMY;  Surgeon: Daneil Dolin, MD;  [facility];  Service: Endoscopy;;  splenic flexure and hepatic flexure, descending colon   ROTATOR CUFF REPAIR  2006   righrt   SHOULDER ARTHROSCOPY WITH SUBACROMIAL DECOMPRESSION, ROTATOR CUFF REPAIR AND BICEP  TENDON REPAIR Left 01/20/2019   Procedure: left shoulder arthroscopy, subacromial decompression, distal clavicle resection, mini open rotator cuff repair;  Surgeon: Garald Balding, MD;  Location: [place];  Service: Orthopedics;  Laterality: Left;   TONSILLECTOMY      Prior to Admission medications   Medication Sig Start Date End Date Taking? Authorizing Provider  allopurinol (ZYLOPRIM) 300 MG tablet Take 300 mg by mouth daily.     Yes [provider]  allopurinol (ZYLOPRIM) 300 MG tablet TAKE 1 TABLET BY MOUTH DAILY 09/13/20  Yes   amLODipine (NORVASC) 10 MG tablet Take 10 mg by mouth daily.     Yes [provider]  amLODipine (NORVASC) 10 MG tablet TAKE 1 TABLET BY MOUTH DAILY 09/13/20  Yes   aspirin 81 MG tablet Take 81 mg by mouth daily.   Yes [provider]  hydrochlorothiazide (HYDRODIURIL) 25 MG tablet Take 25 mg by mouth daily.   Yes [provider]  hydrochlorothiazide (HYDRODIURIL) 25 MG tablet TAKE 1 TABLET BY MOUTH DAILY 09/13/20  Yes   levothyroxine (SYNTHROID) 112 MCG tablet TAKE 1 TABLET BY MOUTH DAILY 09/13/20  Yes   levothyroxine (SYNTHROID, LEVOTHROID) 112 MCG tablet Take 112 mcg by mouth daily. 06/11/17  Yes [provider]  methocarbamol (ROBAXIN) 500 MG tablet Take 500 mg by mouth daily as needed for muscle spasms.  06/11/17  Yes [provider]  pantoprazole (PROTONIX) 40 MG tablet TAKE 1 TABLET BY MOUTH ONCE DAILY 09/13/20  Yes  simvastatin (ZOCOR) 20 MG tablet Take 20 mg by mouth at bedtime.     Yes [provider]  simvastatin (ZOCOR) 20 MG tablet Take 1 tablet (20 mg total) by mouth at bedtime. 09/14/20  Yes   telmisartan (MICARDIS) 80 MG tablet Take 1 tablet (80 mg total) by mouth daily. 09/14/20  Yes   fluticasone (FLONASE) 50 MCG/ACT nasal spray Place 2 sprays into both nostrils daily as needed for allergies.  08/08/10   [provider]  fluticasone (FLONASE) 50 MCG/ACT nasal spray Place 2  sprays into both nostrils daily. 12/13/20     metFORMIN (GLUCOPHAGE) 500 MG tablet Take 1 tablet (500 mg total) by mouth 2 (two) times daily. 09/06/20     MICARDIS 80 MG tablet Take 80 mg by mouth daily.  08/20/10   [provider]  pantoprazole (PROTONIX) 40 MG tablet Take 40 mg by mouth daily. 06/12/17   [provider]    Allergies as of 03/21/2021   (No Known Allergies)    Family History  Problem Relation Age of Onset   Colon cancer Maternal Grandfather 42       died age 67   Prostate cancer Father 73       died age 46    Social History   Socioeconomic History   Marital status: Married    Spouse name: Not on file   Number of children: 2   Years of education: Not on file   Highest education level: Not on file  Occupational History    Employer: [employer]  Tobacco Use   Smoking status: Former    Packs/day: 0.30    Types: Cigarettes   Smokeless tobacco: Never  Substance and Sexual Activity   Alcohol use: Yes    Comment: 2-3 drinks a day   Drug use: No   Sexual activity: Not on file  Other Topics Concern   Not on file  Social History Narrative   Not on file   Social Determinants of Health   Financial Resource Strain: Not on file  Food Insecurity: Not on file  Transportation Needs: Not on file  Physical Activity: Not on file  Stress: Not on file  Social Connections: Not on file  Intimate Partner Violence: Not on file    Review of Systems: See HPI, otherwise negative ROS  Physical Exam: BP (!) 147/87    Pulse 81    Temp (!) 97.1 F (36.2 C) (Temporal)    Ht 5\' 9"  (1.753 m)    Wt 223 lb 6.4 oz (101.3 kg)    BMI 32.99 kg/m  General:   Alert,  Well-developed, well-nourished, pleasant and cooperative in NAD Neck:  Supple; no masses or thyromegaly. No significant cervical adenopathy. Lungs:  Clear throughout to auscultation.   No wheezes, crackles, or rhonchi. No acute distress. Heart:  Regular rate and rhythm; no murmurs, clicks, rubs,  or  gallops. Abdomen: Obese.  Positive bowel sounds soft and nontender without appreciable mass organomegaly pulses:  Normal pulses noted. Extremities:  Without clubbing or edema.  Impression/Plan: Very pleasant 66 year old gentleman with a history of multiple colonic adenomas removed over multiple colonoscopies.  He is due for surveillance colonoscopy at this time.  I have offered the patient a surveillance colonoscopy per plan.  The risks, benefits, limitations, alternatives and imponderables have been reviewed with the patient. Questions have been answered. All parties are agreeable.  ASA 3/propofol.  Depending on scheduling, due to my upcoming leave, I explained to the patient  that Dr. Abbey Chatters may step in and perform the procedure in my absence.  Patient is agreeable to this approach as well if needed.       Notice: This dictation was prepared with Dragon dictation along with smaller phrase technology. Any transcriptional errors that result from this process are unintentional and may not be corrected upon review.

## 2021-03-21 NOTE — H&P (View-Only) (Signed)
Primary Care Physician:  Asencion Noble, MD Primary Gastroenterologist:  Dr. Gala Romney  Pre-Procedure History & Physical: HPI:  Louis Mccullough is a 66 y.o. male here for for consideration of setting up a surveillance colonoscopy.  Patient has multiple colonic adenomas removed over multiple colonoscopies.  Last colonoscopy 10/24/2017 yielded multiple colonic adenomas with the largest being 11 mm polyp in the descending segment and pancolonic diverticulosis.  Currently, patient has no lower GI tract symptoms.  GERD well-controlled on Protonix 40 mg once daily.  Past Medical History:  Diagnosis Date   GERD (gastroesophageal reflux disease)    states he had previous EGD   Gout    History of bronchitis    HTN (hypertension)    Hx of adenomatous colonic polyps    Hyperlipidemia    Hypertriglyceridemia    Hypothyroidism    Seasonal allergies    Shoulder impingement, left    Wears glasses     Past Surgical History:  Procedure Laterality Date   BILATERAL CARPAL TUNNEL RELEASE Bilateral 02/12/2014   Procedure: BILATERAL CARPAL TUNNEL RELEASE;  Surgeon: Daryll Brod, MD;  Location: [facility];  Service: Orthopedics;  Laterality: Bilateral;   CHOLECYSTECTOMY     COLONOSCOPY  2006   adenomatous polyp, diverticulosis   COLONOSCOPY  09/29/2010   Procedure: COLONOSCOPY;  Surgeon: Daneil Dolin, MD;  [facility];  Service: Endoscopy;  Laterality: N/A;   COLONOSCOPY WITH PROPOFOL N/A 10/24/2017   Procedure: COLONOSCOPY WITH PROPOFOL;  Surgeon: Daneil Dolin, MD;  Location: [facility];  Service: Endoscopy;  Laterality: N/A;  11:00am   NECK SURGERY  2001   cerv fusion   POLYPECTOMY  10/24/2017   Procedure: POLYPECTOMY;  Surgeon: Daneil Dolin, MD;  Location: [facility];  Service: Endoscopy;;  splenic flexure and hepatic flexure, descending colon   ROTATOR CUFF REPAIR  2006   righrt   SHOULDER ARTHROSCOPY WITH SUBACROMIAL DECOMPRESSION, ROTATOR CUFF REPAIR AND BICEP  TENDON REPAIR Left 01/20/2019   Procedure: left shoulder arthroscopy, subacromial decompression, distal clavicle resection, mini open rotator cuff repair;  Surgeon: Garald Balding, MD;  Location: [facility];  Service: Orthopedics;  Laterality: Left;   TONSILLECTOMY      Prior to Admission medications   Medication Sig Start Date End Date Taking? Authorizing Provider  allopurinol (ZYLOPRIM) 300 MG tablet Take 300 mg by mouth daily.     Yes [provider]  allopurinol (ZYLOPRIM) 300 MG tablet TAKE 1 TABLET BY MOUTH DAILY 09/13/20  Yes   amLODipine (NORVASC) 10 MG tablet Take 10 mg by mouth daily.     Yes [provider]  amLODipine (NORVASC) 10 MG tablet TAKE 1 TABLET BY MOUTH DAILY 09/13/20  Yes   aspirin 81 MG tablet Take 81 mg by mouth daily.   Yes [provider]  hydrochlorothiazide (HYDRODIURIL) 25 MG tablet Take 25 mg by mouth daily.   Yes [provider]  hydrochlorothiazide (HYDRODIURIL) 25 MG tablet TAKE 1 TABLET BY MOUTH DAILY 09/13/20  Yes   levothyroxine (SYNTHROID) 112 MCG tablet TAKE 1 TABLET BY MOUTH DAILY 09/13/20  Yes   levothyroxine (SYNTHROID, LEVOTHROID) 112 MCG tablet Take 112 mcg by mouth daily. 06/11/17  Yes [provider]  methocarbamol (ROBAXIN) 500 MG tablet Take 500 mg by mouth daily as needed for muscle spasms.  06/11/17  Yes [provider]  pantoprazole (PROTONIX) 40 MG tablet TAKE 1 TABLET BY MOUTH ONCE DAILY 09/13/20  Yes  simvastatin (ZOCOR) 20 MG tablet Take 20 mg by mouth at bedtime.     Yes [provider]  simvastatin (ZOCOR) 20 MG tablet Take 1 tablet (20 mg total) by mouth at bedtime. 09/14/20  Yes   telmisartan (MICARDIS) 80 MG tablet Take 1 tablet (80 mg total) by mouth daily. 09/14/20  Yes   fluticasone (FLONASE) 50 MCG/ACT nasal spray Place 2 sprays into both nostrils daily as needed for allergies.  08/08/10   [provider]  fluticasone (FLONASE) 50 MCG/ACT nasal spray Place 2  sprays into both nostrils daily. 12/13/20     metFORMIN (GLUCOPHAGE) 500 MG tablet Take 1 tablet (500 mg total) by mouth 2 (two) times daily. 09/06/20     MICARDIS 80 MG tablet Take 80 mg by mouth daily.  08/20/10   [provider]  pantoprazole (PROTONIX) 40 MG tablet Take 40 mg by mouth daily. 06/12/17   [provider]    Allergies as of 03/21/2021   (No Known Allergies)    Family History  Problem Relation Age of Onset   Colon cancer Maternal Grandfather 53       died age 53   Prostate cancer Father 37       died age 74    Social History   Socioeconomic History   Marital status: Married    Spouse name: Not on file   Number of children: 2   Years of education: Not on file   Highest education level: Not on file  Occupational History    Employer: [employer]  Tobacco Use   Smoking status: Former    Packs/day: 0.30    Types: Cigarettes   Smokeless tobacco: Never  Substance and Sexual Activity   Alcohol use: Yes    Comment: 2-3 drinks a day   Drug use: No   Sexual activity: Not on file  Other Topics Concern   Not on file  Social History Narrative   Not on file   Social Determinants of Health   Financial Resource Strain: Not on file  Food Insecurity: Not on file  Transportation Needs: Not on file  Physical Activity: Not on file  Stress: Not on file  Social Connections: Not on file  Intimate Partner Violence: Not on file    Review of Systems: See HPI, otherwise negative ROS  Physical Exam: BP (!) 147/87    Pulse 81    Temp (!) 97.1 F (36.2 C) (Temporal)    Ht 5\' 9"  (1.753 m)    Wt 223 lb 6.4 oz (101.3 kg)    BMI 32.99 kg/m  General:   Alert,  Well-developed, well-nourished, pleasant and cooperative in NAD Neck:  Supple; no masses or thyromegaly. No significant cervical adenopathy. Lungs:  Clear throughout to auscultation.   No wheezes, crackles, or rhonchi. No acute distress. Heart:  Regular rate and rhythm; no murmurs, clicks, rubs,  or  gallops. Abdomen: Obese.  Positive bowel sounds soft and nontender without appreciable mass organomegaly pulses:  Normal pulses noted. Extremities:  Without clubbing or edema.  Impression/Plan: Very pleasant 66 year old gentleman with a history of multiple colonic adenomas removed over multiple colonoscopies.  He is due for surveillance colonoscopy at this time.  I have offered the patient a surveillance colonoscopy per plan.  The risks, benefits, limitations, alternatives and imponderables have been reviewed with the patient. Questions have been answered. All parties are agreeable.  ASA 3/propofol.  Depending on scheduling, due to my upcoming leave, I explained to the patient  that Dr. Abbey Chatters may step in and perform the procedure in my absence.  Patient is agreeable to this approach as well if needed.       Notice: This dictation was prepared with Dragon dictation along with smaller phrase technology. Any transcriptional errors that result from this process are unintentional and may not be corrected upon review.

## 2021-03-23 ENCOUNTER — Encounter: Payer: Self-pay | Admitting: *Deleted

## 2021-03-23 ENCOUNTER — Telehealth: Payer: Self-pay | Admitting: *Deleted

## 2021-03-23 ENCOUNTER — Other Ambulatory Visit (HOSPITAL_COMMUNITY): Payer: Self-pay

## 2021-03-23 MED ORDER — CLENPIQ 10-3.5-12 MG-GM -GM/160ML PO SOLN
1.0000 | Freq: Once | ORAL | 0 refills | Status: AC
  Filled 2021-03-23: qty 320, 1d supply, fill #0

## 2021-03-23 NOTE — Addendum Note (Signed)
Addended by: Cheron Every on: 03/23/2021 04:07 PM   Modules accepted: Orders

## 2021-03-23 NOTE — Telephone Encounter (Signed)
Per Dr. Gala Romney regarding metformin " I would just hold his evening dose of metformin the night before and then the morning of the procedure."

## 2021-03-23 NOTE — Telephone Encounter (Signed)
LMOVM to call back to schedule TCS with propofol, Dr. Gala Romney, ASA 3

## 2021-03-23 NOTE — Telephone Encounter (Signed)
Patient returned call. He has been scheduled for 1/27 at 7:30am. Advised will call back with pre-op appt. Will mail prep instructions and send rx to pharmacy.   Called [facility]. No PA is required for procedure. Called ref# [id number]

## 2021-03-24 ENCOUNTER — Encounter: Payer: Self-pay | Admitting: *Deleted

## 2021-03-29 ENCOUNTER — Other Ambulatory Visit (HOSPITAL_COMMUNITY): Payer: Self-pay

## 2021-03-31 NOTE — Patient Instructions (Signed)
Louis Mccullough  03/31/2021     @PREFPERIOPPHARMACY @   Your procedure is scheduled on  04/07/2021.   Report to [name] at  [phone]  A.M.   Call this number if you have problems the morning of surgery:  [phone]   Remember:  Follow the diet and prep instructions given to you by the office.    Take these medicines the morning of surgery with A SIP OF WATER        allopurinol, amlodipine, levothyroxine, robaxin (if needed), protonix.     Do not wear jewelry, make-up or nail polish.  Do not wear lotions, powders, or perfumes, or deodorant.  Do not shave 48 hours prior to surgery.  Men may shave face and neck.  Do not bring valuables to the hospital.  [facility] is not responsible for any belongings or valuables.  Contacts, dentures or bridgework may not be worn into surgery.  Leave your suitcase in the car.  After surgery it may be brought to your room.  For patients admitted to the hospital, discharge time will be determined by your treatment team.  Patients discharged the day of surgery will not be allowed to drive home and must have someone with them for 24 hours.    Special instructions:   DO NOT smoke tobacco or vape for 24 hours before your procedure.  Please read over the following fact sheets that you were given. Anesthesia Post-op Instructions and Care and Recovery After Surgery      Colonoscopy, Adult, Care After This sheet gives you information about how to care for yourself after your procedure. Your health care provider may also give you more specific instructions. If you have problems or questions, contact your health care provider. What can I expect after the procedure? After the procedure, it is common to have: A small amount of blood in your stool for 24 hours after the procedure. Some gas. Mild cramping or bloating of your abdomen. Follow these instructions at home: Eating and drinking  Drink enough fluid to keep your urine pale  yellow. Follow instructions from your health care provider about eating or drinking restrictions. Resume your normal diet as instructed by your health care provider. Avoid heavy or fried foods that are hard to digest. Activity Rest as told by your health care provider. Avoid sitting for a long time without moving. Get up to take short walks every 1-2 hours. This is important to improve blood flow and breathing. Ask for help if you feel weak or unsteady. Return to your normal activities as told by your health care provider. Ask your health care provider what activities are safe for you. Managing cramping and bloating  Try walking around when you have cramps or feel bloated. Apply heat to your abdomen as told by your health care provider. Use the heat source that your health care provider recommends, such as a moist heat pack or a heating pad. Place a towel between your skin and the heat source. Leave the heat on for 20-30 minutes. Remove the heat if your skin turns bright red. This is especially important if you are unable to feel pain, heat, or cold. You may have a greater risk of getting burned. General instructions If you were given a sedative during the procedure, it can affect you for several hours. Do not drive or operate machinery until your health care provider says that it is safe. For the first 24 hours after the procedure:  Do not sign important documents. Do not drink alcohol. Do your regular daily activities at a slower pace than normal. Eat soft foods that are easy to digest. Take over-the-counter and prescription medicines only as told by your health care provider. Keep all follow-up visits as told by your health care provider. This is important. Contact a health care provider if: You have blood in your stool 2-3 days after the procedure. Get help right away if you have: More than a small spotting of blood in your stool. Large blood clots in your stool. Swelling of your  abdomen. Nausea or vomiting. A fever. Increasing pain in your abdomen that is not relieved with medicine. Summary After the procedure, it is common to have a small amount of blood in your stool. You may also have mild cramping and bloating of your abdomen. If you were given a sedative during the procedure, it can affect you for several hours. Do not drive or operate machinery until your health care provider says that it is safe. Get help right away if you have a lot of blood in your stool, nausea or vomiting, a fever, or increased pain in your abdomen. This information is not intended to replace advice given to you by your health care provider. Make sure you discuss any questions you have with your health care provider. Document Revised: 01/02/2019 Document Reviewed: 09/22/2018 Elsevier Patient Education  [place] After This sheet gives you information about how to care for yourself after your procedure. Your health care provider may also give you more specific instructions. If you have problems or questions, contact your health care provider. What can I expect after the procedure? After the procedure, it is common to have: Tiredness. Forgetfulness about what happened after the procedure. Impaired judgment for important decisions. Nausea or vomiting. Some difficulty with balance. Follow these instructions at home: For the time period you were told by your health care provider:   Rest as needed. Do not participate in activities where you could fall or become injured. Do not drive or use machinery. Do not drink alcohol. Do not take sleeping pills or medicines that cause drowsiness. Do not make important decisions or sign legal documents. Do not take care of children on your own. Eating and drinking Follow the diet that is recommended by your health care provider. Drink enough fluid to keep your urine pale yellow. If you vomit: Drink water,  juice, or soup when you can drink without vomiting. Make sure you have little or no nausea before eating solid foods. General instructions Have a responsible adult stay with you for the time you are told. It is important to have someone help care for you until you are awake and alert. Take over-the-counter and prescription medicines only as told by your health care provider. If you have sleep apnea, surgery and certain medicines can increase your risk for breathing problems. Follow instructions from your health care provider about wearing your sleep device: Anytime you are sleeping, including during daytime naps. While taking prescription pain medicines, sleeping medicines, or medicines that make you drowsy. Avoid smoking. Keep all follow-up visits as told by your health care provider. This is important. Contact a health care provider if: You keep feeling nauseous or you keep vomiting. You feel light-headed. You are still sleepy or having trouble with balance after 24 hours. You develop a rash. You have a fever. You have redness or swelling around the IV site. Get help right away  if: You have trouble breathing. You have new-onset confusion at home. Summary For several hours after your procedure, you may feel tired. You may also be forgetful and have poor judgment. Have a responsible adult stay with you for the time you are told. It is important to have someone help care for you until you are awake and alert. Rest as told. Do not drive or operate machinery. Do not drink alcohol or take sleeping pills. Get help right away if you have trouble breathing, or if you suddenly become confused. This information is not intended to replace advice given to you by your health care provider. Make sure you discuss any questions you have with your health care provider. Document Revised: 11/12/2019 Document Reviewed: 01/29/2019 Elsevier Patient Education  2022 [employer].

## 2021-04-04 ENCOUNTER — Encounter (HOSPITAL_COMMUNITY): Payer: Self-pay

## 2021-04-04 ENCOUNTER — Encounter (HOSPITAL_COMMUNITY)
Admission: RE | Admit: 2021-04-04 | Discharge: 2021-04-04 | Disposition: A | Discharge status: Home / Self Care | Payer: No Typology Code available for payment source | Source: Ambulatory Visit | Attending: Internal Medicine | Admitting: Internal Medicine

## 2021-04-04 VITALS — BP 122/73 | HR 76 | Temp 97.2°F | Resp 18 | Ht 69.0 in | Wt 223.4 lb

## 2021-04-04 DIAGNOSIS — Z79899 Other long term (current) drug therapy: Secondary | ICD-10-CM | POA: Diagnosis not present

## 2021-04-04 DIAGNOSIS — Z01818 Encounter for other preprocedural examination: Secondary | ICD-10-CM | POA: Diagnosis present

## 2021-04-04 LAB — BASIC METABOLIC PANEL
Anion gap: 10 (ref 5–15)
BUN: 18 mg/dL (ref 8–23)
CO2: 24 mmol/L (ref 22–32)
Calcium: 9.5 mg/dL (ref 8.9–10.3)
Chloride: 95 mmol/L — ABNORMAL LOW (ref 98–111)
Creatinine, Ser: 0.84 mg/dL (ref 0.61–1.24)
GFR, Estimated: >60 mL/min (ref >60)
Glucose, Bld: 347 mg/dL — ABNORMAL HIGH (ref 70–99)
Potassium: 4 mmol/L (ref 3.5–5.1)
Sodium: 129 mmol/L — ABNORMAL LOW (ref 135–145)

## 2021-04-07 ENCOUNTER — Ambulatory Visit (HOSPITAL_COMMUNITY): Payer: No Typology Code available for payment source | Admitting: Registered Nurse

## 2021-04-07 ENCOUNTER — Ambulatory Visit (HOSPITAL_COMMUNITY)
Admission: RE | Admit: 2021-04-07 | Discharge: 2021-04-07 | Disposition: A | Discharge status: Home / Self Care | Payer: No Typology Code available for payment source | Attending: Internal Medicine | Admitting: Internal Medicine

## 2021-04-07 ENCOUNTER — Encounter (HOSPITAL_COMMUNITY)
Admission: RE | Disposition: A | Discharge status: Home / Self Care | Payer: Self-pay | Source: Home / Self Care | Attending: Internal Medicine

## 2021-04-07 ENCOUNTER — Encounter (HOSPITAL_COMMUNITY): Payer: Self-pay | Admitting: Internal Medicine

## 2021-04-07 DIAGNOSIS — I10 Essential (primary) hypertension: Secondary | ICD-10-CM | POA: Diagnosis not present

## 2021-04-07 DIAGNOSIS — K219 Gastro-esophageal reflux disease without esophagitis: Secondary | ICD-10-CM | POA: Insufficient documentation

## 2021-04-07 DIAGNOSIS — K5730 Diverticulosis of large intestine without perforation or abscess without bleeding: Secondary | ICD-10-CM | POA: Insufficient documentation

## 2021-04-07 DIAGNOSIS — Z1211 Encounter for screening for malignant neoplasm of colon: Secondary | ICD-10-CM | POA: Insufficient documentation

## 2021-04-07 DIAGNOSIS — D123 Benign neoplasm of transverse colon: Secondary | ICD-10-CM | POA: Insufficient documentation

## 2021-04-07 DIAGNOSIS — Z79899 Other long term (current) drug therapy: Secondary | ICD-10-CM | POA: Diagnosis not present

## 2021-04-07 DIAGNOSIS — Z87891 Personal history of nicotine dependence: Secondary | ICD-10-CM | POA: Diagnosis not present

## 2021-04-07 DIAGNOSIS — Z86010 Personal history of colonic polyps: Secondary | ICD-10-CM | POA: Diagnosis not present

## 2021-04-07 DIAGNOSIS — E119 Type 2 diabetes mellitus without complications: Secondary | ICD-10-CM | POA: Diagnosis not present

## 2021-04-07 DIAGNOSIS — D124 Benign neoplasm of descending colon: Secondary | ICD-10-CM | POA: Diagnosis not present

## 2021-04-07 DIAGNOSIS — E039 Hypothyroidism, unspecified: Secondary | ICD-10-CM | POA: Diagnosis not present

## 2021-04-07 HISTORY — PX: COLONOSCOPY WITH PROPOFOL: SHX5780

## 2021-04-07 HISTORY — PX: POLYPECTOMY: SHX5525

## 2021-04-07 LAB — GLUCOSE, CAPILLARY
Glucose-Capillary: 326 mg/dL — ABNORMAL HIGH (ref 70–99)
Glucose-Capillary: 309 mg/dL — ABNORMAL HIGH (ref 70–99)

## 2021-04-07 SURGERY — COLONOSCOPY WITH PROPOFOL
Anesthesia: General

## 2021-04-07 MED ORDER — PROPOFOL 10 MG/ML IV BOLUS
INTRAVENOUS | Status: DC | PRN
  Administered 2021-04-07 (×5): 30 mg via INTRAVENOUS
  Administered 2021-04-07: 50 mg via INTRAVENOUS
  Administered 2021-04-07: 40 mg via INTRAVENOUS

## 2021-04-07 MED ORDER — LACTATED RINGERS IV SOLN: INTRAVENOUS | Status: DC

## 2021-04-07 NOTE — Transfer of Care (Signed)
Immediate Anesthesia Transfer of Care Note  Patient: Louis Mccullough  Procedure(s) Performed: COLONOSCOPY WITH PROPOFOL POLYPECTOMY  Patient Location: PACU  Anesthesia Type:MAC  Level of Consciousness: awake, alert  and oriented  Airway & Oxygen Therapy: Patient Spontanous Breathing  Post-op Assessment: Report given to RN, Post -op Vital signs reviewed and stable and Patient moving all extremities X 4  Post vital signs: Reviewed and stable  Last Vitals:  Vitals Value Taken Time  BP 92/58 04/07/21 0804  Temp 37.2 C 04/07/21 0804  Pulse 70 04/07/21 0804  Resp 16 04/07/21 0804  SpO2 99 % 04/07/21 0804    Last Pain:  Vitals:   04/07/21 0804  TempSrc: Oral  PainSc:          Complications: No notable events documented.

## 2021-04-07 NOTE — Op Note (Signed)
[facility] Patient Name: Louis Mccullough Procedure Date: 04/07/2021 7:22 AM MRN: [id number] Date of Birth: 1956-01-28 Attending MD: Norvel Richards , MD CSN: [id number] Age: 66 Admit Type: Outpatient Procedure:                Colonoscopy Indications:              High risk colon cancer surveillance: Personal                            history of colonic polyps Providers:                Norvel Richards, MD, Charlsie Quest. Insurance claims handler, Therapist, sports,                            Suzan Garibaldi. Risa Grill, Technician Referring MD:              Medicines:                Propofol per Anesthesia Complications:            No immediate complications. Estimated Blood Loss:     Estimated blood loss was minimal. Procedure:                Pre-Anesthesia Assessment:                           - Prior to the procedure, a History and Physical                            was performed, and patient medications and                            allergies were reviewed. The patient's tolerance of                            previous anesthesia was also reviewed. The risks                            and benefits of the procedure and the sedation                            options and risks were discussed with the patient.                            All questions were answered, and informed consent                            was obtained. Prior Anticoagulants: The patient has                            taken no previous anticoagulant or antiplatelet                            agents. ASA Grade Assessment: III - A patient with  severe systemic disease. After reviewing the risks                            and benefits, the patient was deemed in                            satisfactory condition to undergo the procedure.                           After obtaining informed consent, the colonoscope                            was passed under direct vision. Throughout the                             procedure, the patient's blood pressure, pulse, and                            oxygen saturations were monitored continuously. The                            [id number]) scope was introduced through                            the anus and advanced to the the cecum, identified                            by appendiceal orifice and ileocecal valve. The                            colonoscopy was performed without difficulty. The                            patient tolerated the procedure well. The quality                            of the bowel preparation was adequate. The                            ileocecal valve, appendiceal orifice, and rectum                            were photographed. The entire colon was well                            visualized. Scope In: 7:43:08 AM Scope Out: 7:57:27 AM Scope Withdrawal Time: 0 hours 9 minutes 43 seconds  Total Procedure Duration: 0 hours 14 minutes 19 seconds  Findings:      The perianal and digital rectal examinations were normal.      Scattered small and large-mouthed diverticula were found in the entire       colon.      Three semi-pedunculated polyps were found in the descending colon, mid       transverse colon and cecum. The polyps were 3 to  5 mm in size. These       polyps were removed with a cold snare. Resection and retrieval were       complete. Estimated blood loss was minimal.      The exam was otherwise without abnormality on direct and retroflexion       views. Impression:               - Diverticulosis in the entire examined colon.                           - Three 3 to 5 mm polyps in the descending colon,                            in the mid transverse colon and in the cecum,                            removed with a cold snare. Resected and retrieved.                           - The examination was otherwise normal on direct                            and retroflexion views. Moderate Sedation:      Moderate  (conscious) sedation was personally administered by an       anesthesia professional. The following parameters were monitored: oxygen       saturation, heart rate, blood pressure, respiratory rate, EKG, adequacy       of pulmonary ventilation, and response to care. Recommendation:           - Patient has a contact number available for                            emergencies. The signs and symptoms of potential                            delayed complications were discussed with the                            patient. Return to normal activities tomorrow.                            Written discharge instructions were provided to the                            patient.                           - Resume previous diet.                           - Continue present medications.                           - Repeat colonoscopy after studies are complete for  surveillance.                           - Return to GI office (date not yet determined).                            Blood sugars recently over 300. Patient urged to                            see Dr. Willey Blade ASAP. Also, by patient's own                            admission, he states he "drinks too much". Long                            discussion. It would be best for patient to                            completely abstain from consuming alcohol. Further                            recommendations to follow after pathology report                            returns for review. Procedure Code(s):        --- Professional ---                           [phone], Colonoscopy, flexible; with removal of                            tumor(s), polyp(s), or other lesion(s) by snare                            technique Diagnosis Code(s):        --- Professional ---                           Z86.010, Personal history of colonic polyps                           K63.5, Polyp of colon                           K57.30, Diverticulosis of  large intestine without                            perforation or abscess without bleeding CPT copyright 2019 American Medical Association. All rights reserved. The codes documented in this report are preliminary and upon coder review may  be revised to meet current compliance requirements. [name]. Mayzee Reichenbach, MD Norvel Richards, MD 04/07/2021 8:09:25 AM This report has been signed electronically. Number of Addenda: 0

## 2021-04-07 NOTE — Anesthesia Postprocedure Evaluation (Signed)
Anesthesia Post Note  Patient: Louis Mccullough  Procedure(s) Performed: COLONOSCOPY WITH PROPOFOL POLYPECTOMY  Patient location during evaluation: Phase II Anesthesia Type: General Level of consciousness: awake Pain management: pain level controlled Vital Signs Assessment: post-procedure vital signs reviewed and stable Respiratory status: spontaneous breathing and respiratory function stable Cardiovascular status: blood pressure returned to baseline and stable Postop Assessment: no headache and no apparent nausea or vomiting Anesthetic complications: no Comments: Late entry   No notable events documented.   Last Vitals:  Vitals:   04/07/21 0802 04/07/21 0804  BP:  (!) 92/58  Pulse: 82 70  Resp: 17 16  Temp: 36.8 C 37.2 C  SpO2: 99% 99%    Last Pain:  Vitals:   04/07/21 0804  TempSrc: Oral  PainSc:                  Louann Sjogren

## 2021-04-07 NOTE — Anesthesia Preprocedure Evaluation (Signed)
Anesthesia Evaluation  Patient identified by MRN, date of birth, ID band Patient awake    Reviewed: Allergy & Precautions, H&P , NPO status , Patient's Chart, lab work & pertinent test results, reviewed documented beta blocker date and time   Airway Mallampati: II  TM Distance: >3 FB Neck ROM: full    Dental no notable dental hx.    Pulmonary neg pulmonary ROS, former smoker,    Pulmonary exam normal breath sounds clear to auscultation       Cardiovascular Exercise Tolerance: Good hypertension, negative cardio ROS   Rhythm:regular Rate:Normal     Neuro/Psych negative neurological ROS  negative psych ROS   GI/Hepatic Neg liver ROS, GERD  Medicated,  Endo/Other  diabetes, Poorly Controlled, Type 2Hypothyroidism   Renal/GU negative Renal ROS  negative genitourinary   Musculoskeletal   Abdominal   Peds  Hematology negative hematology ROS (+)   Anesthesia Other Findings   Reproductive/Obstetrics negative OB ROS                             Anesthesia Physical Anesthesia Plan  ASA: 3  Anesthesia Plan: General   Post-op Pain Management:    Induction:   PONV Risk Score and Plan: Propofol infusion  Airway Management Planned:   Additional Equipment:   Intra-op Plan:   Post-operative Plan:   Informed Consent: I have reviewed the patients History and Physical, chart, labs and discussed the procedure including the risks, benefits and alternatives for the proposed anesthesia with the patient or authorized representative who has indicated his/her understanding and acceptance.     Dental Advisory Given  Plan Discussed with: CRNA  Anesthesia Plan Comments:         Anesthesia Quick Evaluation

## 2021-04-07 NOTE — Interval H&P Note (Signed)
History and Physical Interval Note:  04/07/2021 7:19 AM  Melodye Ped  has presented today for surgery, with the diagnosis of h/o colon polyps.  The various methods of treatment have been discussed with the patient and family. After consideration of risks, benefits and other options for treatment, the patient has consented to  Procedure(s) with comments: COLONOSCOPY WITH PROPOFOL (N/A) - 7:30am as a surgical intervention.  The patient's history has been reviewed, patient examined, no change in status, stable for surgery.  I have reviewed the patient's chart and labs.  Questions were answered to the patient's satisfaction.     Manus Rudd  Patient seen and examined.  Blood sugars over 300x2 this week.  New diagnosis of diabetes.  Discussed with anesthesia.  Surveillance colonoscopy today per plan.  Patient is getting follow-up with his PCP Dr. Willey Blade in the near future. The risks, benefits, limitations, alternatives and imponderables have been reviewed with the patient. Questions have been answered. All parties are agreeable.

## 2021-04-07 NOTE — Discharge Instructions (Addendum)
°  Colonoscopy Discharge Instructions  Read the instructions outlined below and refer to this sheet in the next few weeks. These discharge instructions provide you with general information on caring for yourself after you leave the hospital. Your doctor may also give you specific instructions. While your treatment has been planned according to the most current medical practices available, unavoidable complications occasionally occur. If you have any problems or questions after discharge, call Dr. Gala Romney at [phone]. ACTIVITY You may resume your regular activity, but move at a slower pace for the next 24 hours.  Take frequent rest periods for the next 24 hours.  Walking will help get rid of the air and reduce the bloated feeling in your belly (abdomen).  No driving for 24 hours (because of the medicine (anesthesia) used during the test).   Do not sign any important legal documents or operate any machinery for 24 hours (because of the anesthesia used during the test).  NUTRITION Drink plenty of fluids.  You may resume your normal diet as instructed by your doctor.  Begin with a light meal and progress to your normal diet. Heavy or fried foods are harder to digest and may make you feel sick to your stomach (nauseated).  Avoid alcoholic beverages for 24 hours or as instructed.  MEDICATIONS You may resume your normal medications unless your doctor tells you otherwise.  WHAT YOU CAN EXPECT TODAY Some feelings of bloating in the abdomen.  Passage of more gas than usual.  Spotting of blood in your stool or on the toilet paper.  IF YOU HAD POLYPS REMOVED DURING THE COLONOSCOPY: No aspirin products for 7 days or as instructed.  No alcohol for 7 days or as instructed.  Eat a soft diet for the next 24 hours.  FINDING OUT THE RESULTS OF YOUR TEST Not all test results are available during your visit. If your test results are not back during the visit, make an appointment with your caregiver to find out the  results. Do not assume everything is normal if you have not heard from your caregiver or the medical facility. It is important for you to follow up on all of your test results.  SEEK IMMEDIATE MEDICAL ATTENTION IF: You have more than a spotting of blood in your stool.  Your belly is swollen (abdominal distention).  You are nauseated or vomiting.  You have a temperature over 101.  You have abdominal pain or discomfort that is severe or gets worse throughout the day.   3 small polyps removed from your colon today  Colon polyp and diverticulosis information provided  Further recommendations to follow pending review of pathology report  Follow-up with Dr. Willey Blade ASAP regarding elevated blood sugars.  I strongly recommend you completely abstain from alcohol ingestion moving forward  At patient request, I called Lattie Haw at [phone] -call rolled to voicemail-left a detailed message.

## 2021-04-10 LAB — SURGICAL PATHOLOGY

## 2021-04-11 ENCOUNTER — Encounter (HOSPITAL_COMMUNITY): Payer: Self-pay | Admitting: Internal Medicine

## 2021-04-11 ENCOUNTER — Encounter: Payer: Self-pay | Admitting: Internal Medicine

## 2021-04-24 ENCOUNTER — Other Ambulatory Visit (HOSPITAL_COMMUNITY): Payer: Self-pay

## 2021-04-24 MED ORDER — FREESTYLE LITE TEST VI STRP
1.0000 | ORAL_STRIP | Freq: Every day | 12 refills | Status: AC
  Filled 2021-04-24: qty 50, 50d supply, fill #0
  Filled 2021-06-05: qty 50, 50d supply, fill #1
  Filled 2021-07-11 – 2022-04-04 (×2): qty 50, 50d supply, fill #2

## 2021-04-24 MED ORDER — FREESTYLE FREEDOM LITE W/DEVICE KIT
1.0000 | PACK | Freq: Every day | 12 refills | Status: AC
  Filled 2021-04-24: qty 1, 1d supply, fill #0
  Filled 2021-12-11: qty 1, 1d supply, fill #1

## 2021-04-24 MED ORDER — FREESTYLE LANCETS MISC
1.0000 | Freq: Every day | 12 refills | Status: AC
Start: 2021-04-24 — End: ?
  Filled 2021-04-24: qty 100, 90d supply, fill #0
  Filled 2022-04-04: qty 100, 90d supply, fill #1

## 2021-05-15 ENCOUNTER — Encounter: Payer: Self-pay | Admitting: Internal Medicine

## 2021-06-05 ENCOUNTER — Other Ambulatory Visit (HOSPITAL_COMMUNITY): Payer: Self-pay

## 2021-06-08 ENCOUNTER — Telehealth: Payer: Self-pay | Admitting: *Deleted

## 2021-06-08 NOTE — Telephone Encounter (Signed)
Patient rec'd letter to sch'd  follow up apt - doesn't know why he needs to schd this - I looked in chart and didn't see anything - can you look and call and let him know why. thanks ? ?Ph# [phone] ?

## 2021-06-12 ENCOUNTER — Other Ambulatory Visit (HOSPITAL_COMMUNITY): Payer: Self-pay

## 2021-07-10 ENCOUNTER — Other Ambulatory Visit (HOSPITAL_COMMUNITY): Payer: Self-pay

## 2021-07-10 MED ORDER — METFORMIN HCL 500 MG PO TABS
500.0000 mg | ORAL_TABLET | Freq: Two times a day (BID) | ORAL | 4 refills | Status: DC
  Filled 2021-07-10: qty 180, 90d supply, fill #0
  Filled 2021-09-07 – 2021-10-11 (×2): qty 180, 90d supply, fill #1
  Filled 2022-01-08: qty 180, 90d supply, fill #2
  Filled 2022-04-04: qty 180, 90d supply, fill #3
  Filled 2022-07-03: qty 180, 90d supply, fill #4

## 2021-07-11 ENCOUNTER — Other Ambulatory Visit (HOSPITAL_COMMUNITY): Payer: Self-pay

## 2021-09-07 ENCOUNTER — Other Ambulatory Visit (HOSPITAL_COMMUNITY): Payer: Self-pay

## 2021-09-08 ENCOUNTER — Other Ambulatory Visit (HOSPITAL_COMMUNITY): Payer: Self-pay

## 2021-10-11 ENCOUNTER — Other Ambulatory Visit (HOSPITAL_COMMUNITY): Payer: Self-pay

## 2021-10-12 ENCOUNTER — Other Ambulatory Visit (HOSPITAL_COMMUNITY): Payer: Self-pay

## 2021-10-12 MED ORDER — LEVOTHYROXINE SODIUM 112 MCG PO TABS
112.0000 ug | ORAL_TABLET | Freq: Every day | ORAL | 4 refills | Status: DC
Start: 2021-10-12 — End: 2022-11-27
  Filled 2021-10-12 – 2021-12-11 (×2): qty 90, 90d supply, fill #0
  Filled 2022-03-09: qty 90, 90d supply, fill #1
  Filled 2022-06-05: qty 90, 90d supply, fill #2
  Filled 2022-09-03: qty 90, 90d supply, fill #3

## 2021-10-12 MED ORDER — SIMVASTATIN 20 MG PO TABS
20.0000 mg | ORAL_TABLET | Freq: Every day | ORAL | 4 refills | Status: DC
Start: 2021-10-12 — End: 2022-11-27
  Filled 2021-10-12 – 2021-12-11 (×2): qty 90, 90d supply, fill #0
  Filled 2022-03-09: qty 90, 90d supply, fill #1
  Filled 2022-06-05: qty 90, 90d supply, fill #2
  Filled 2022-09-03: qty 90, 90d supply, fill #3

## 2021-10-12 MED ORDER — TELMISARTAN 80 MG PO TABS
80.0000 mg | ORAL_TABLET | Freq: Every day | ORAL | 4 refills | Status: DC
  Filled 2021-10-12 – 2021-12-11 (×2): qty 90, 90d supply, fill #0
  Filled 2022-03-09: qty 90, 90d supply, fill #1
  Filled 2022-06-05: qty 90, 90d supply, fill #2
  Filled 2022-09-03: qty 90, 90d supply, fill #3

## 2021-10-12 MED ORDER — ALLOPURINOL 300 MG PO TABS
300.0000 mg | ORAL_TABLET | Freq: Every day | ORAL | 4 refills | Status: DC
  Filled 2021-10-12 – 2021-12-11 (×2): qty 90, 90d supply, fill #0
  Filled 2022-03-09: qty 90, 90d supply, fill #1
  Filled 2022-06-05: qty 90, 90d supply, fill #2
  Filled 2022-09-03: qty 90, 90d supply, fill #3

## 2021-10-12 MED ORDER — PANTOPRAZOLE SODIUM 40 MG PO TBEC
40.0000 mg | DELAYED_RELEASE_TABLET | Freq: Every day | ORAL | 4 refills | Status: DC
Start: 2021-10-12 — End: 2022-11-27
  Filled 2021-10-12 – 2021-12-11 (×2): qty 90, 90d supply, fill #0
  Filled 2022-03-09: qty 90, 90d supply, fill #1
  Filled 2022-06-05: qty 90, 90d supply, fill #2
  Filled 2022-09-03: qty 90, 90d supply, fill #3

## 2021-10-12 MED ORDER — HYDROCHLOROTHIAZIDE 25 MG PO TABS
25.0000 mg | ORAL_TABLET | Freq: Every day | ORAL | 4 refills | Status: DC
  Filled 2021-10-12 – 2021-12-11 (×2): qty 90, 90d supply, fill #0
  Filled 2022-03-09: qty 90, 90d supply, fill #1
  Filled 2022-06-05: qty 90, 90d supply, fill #2
  Filled 2022-09-03: qty 90, 90d supply, fill #3

## 2021-10-12 MED ORDER — AMLODIPINE BESYLATE 10 MG PO TABS
10.0000 mg | ORAL_TABLET | Freq: Every day | ORAL | 4 refills | Status: DC
  Filled 2021-10-12 – 2021-12-11 (×2): qty 90, 90d supply, fill #0
  Filled 2022-03-09: qty 90, 90d supply, fill #1
  Filled 2022-06-05: qty 90, 90d supply, fill #2
  Filled 2022-09-03: qty 90, 90d supply, fill #3

## 2021-12-11 ENCOUNTER — Other Ambulatory Visit (HOSPITAL_COMMUNITY): Payer: Self-pay

## 2021-12-12 ENCOUNTER — Other Ambulatory Visit (HOSPITAL_COMMUNITY): Payer: Self-pay

## 2022-01-08 ENCOUNTER — Other Ambulatory Visit (HOSPITAL_COMMUNITY): Payer: Self-pay

## 2022-01-18 ENCOUNTER — Other Ambulatory Visit (HOSPITAL_BASED_OUTPATIENT_CLINIC_OR_DEPARTMENT_OTHER): Payer: Self-pay

## 2022-01-18 MED ORDER — AREXVY 120 MCG/0.5ML IM SUSR
INTRAMUSCULAR | 0 refills | Status: AC
  Filled 2022-01-18: qty 0.5, 1d supply, fill #0

## 2022-01-19 ENCOUNTER — Other Ambulatory Visit (HOSPITAL_BASED_OUTPATIENT_CLINIC_OR_DEPARTMENT_OTHER): Payer: Self-pay

## 2022-03-09 ENCOUNTER — Other Ambulatory Visit (HOSPITAL_COMMUNITY): Payer: Self-pay

## 2022-03-22 DIAGNOSIS — E1129 Type 2 diabetes mellitus with other diabetic kidney complication: Secondary | ICD-10-CM | POA: Diagnosis not present

## 2022-06-05 ENCOUNTER — Other Ambulatory Visit (HOSPITAL_COMMUNITY): Payer: Self-pay

## 2022-06-08 ENCOUNTER — Other Ambulatory Visit (HOSPITAL_COMMUNITY): Payer: Self-pay | Admitting: Internal Medicine

## 2022-06-08 DIAGNOSIS — M5186 Other intervertebral disc disorders, lumbar region: Secondary | ICD-10-CM | POA: Diagnosis not present

## 2022-06-08 DIAGNOSIS — B070 Plantar wart: Secondary | ICD-10-CM | POA: Diagnosis not present

## 2022-07-03 ENCOUNTER — Ambulatory Visit (HOSPITAL_COMMUNITY)
Admission: RE | Admit: 2022-07-03 | Discharge: 2022-07-03 | Disposition: A | Discharge status: Home / Self Care | Payer: 59 | Source: Ambulatory Visit | Attending: Internal Medicine | Admitting: Internal Medicine

## 2022-07-03 ENCOUNTER — Other Ambulatory Visit (HOSPITAL_COMMUNITY): Payer: Self-pay

## 2022-07-03 DIAGNOSIS — M5126 Other intervertebral disc displacement, lumbar region: Secondary | ICD-10-CM | POA: Diagnosis not present

## 2022-07-03 DIAGNOSIS — M5186 Other intervertebral disc disorders, lumbar region: Secondary | ICD-10-CM | POA: Insufficient documentation

## 2022-07-31 DIAGNOSIS — Z6834 Body mass index (BMI) 34.0-34.9, adult: Secondary | ICD-10-CM | POA: Diagnosis not present

## 2022-07-31 DIAGNOSIS — M48061 Spinal stenosis, lumbar region without neurogenic claudication: Secondary | ICD-10-CM | POA: Diagnosis not present

## 2022-08-28 NOTE — Therapy (Unsigned)
OUTPATIENT PHYSICAL THERAPY THORACOLUMBAR EVALUATION   Patient Name: Louis Mccullough MRN: [id number] DOB:1955-10-26, 67 y.o., male Today's Date: 08/30/2022  END OF SESSION:  PT End of Session - 08/30/22 1751     Visit Number 1    Number of Visits 6    Date for PT Re-Evaluation 10/25/22    Authorization Type [facility] [employer]    PT Start Time 1615    PT Stop Time 1700    PT Time Calculation (min) 45 min    Activity Tolerance Patient tolerated treatment well    Behavior During Therapy WFL for tasks assessed/performed             Past Medical History:  Diagnosis Date   GERD (gastroesophageal reflux disease)    states he had previous EGD   Gout    History of bronchitis    HTN (hypertension)    Hx of adenomatous colonic polyps    Hyperlipidemia    Hypertriglyceridemia    Hypothyroidism    Seasonal allergies    Shoulder impingement, left    Wears glasses    Past Surgical History:  Procedure Laterality Date   BILATERAL CARPAL TUNNEL RELEASE Bilateral 02/12/2014   Procedure: BILATERAL CARPAL TUNNEL RELEASE;  Surgeon: Cindee Salt, MD;  Location: [facility];  Service: Orthopedics;  Laterality: Bilateral;   CHOLECYSTECTOMY     COLONOSCOPY  03/12/2004   adenomatous polyp, diverticulosis   COLONOSCOPY  09/29/2010   Procedure: COLONOSCOPY;  Surgeon: Corbin Ade, MD;  [facility];  Service: Endoscopy;  Laterality: N/A;   COLONOSCOPY WITH PROPOFOL N/A 10/24/2017   Procedure: COLONOSCOPY WITH PROPOFOL;  Surgeon: Corbin Ade, MD;  [facility];  Service: Endoscopy;  Laterality: N/A;  11:00am   COLONOSCOPY WITH PROPOFOL N/A 04/07/2021   Procedure: COLONOSCOPY WITH PROPOFOL;  Surgeon: Corbin Ade, MD;  [facility];  Service: Endoscopy;  Laterality: N/A;  7:30am   NECK SURGERY  03/13/1999   cerv fusion   POLYPECTOMY  10/24/2017   Procedure: POLYPECTOMY;  Surgeon: Corbin Ade, MD;  [facility];  Service: Endoscopy;;   splenic flexure and hepatic flexure, descending colon   POLYPECTOMY  04/07/2021   Procedure: POLYPECTOMY;  Surgeon: Corbin Ade, MD;  [facility];  Service: Endoscopy;;   ROTATOR CUFF REPAIR Right    ROTATOR CUFF REPAIR Left    SHOULDER ARTHROSCOPY WITH SUBACROMIAL DECOMPRESSION, ROTATOR CUFF REPAIR AND BICEP TENDON REPAIR Left 01/20/2019   Procedure: left shoulder arthroscopy, subacromial decompression, distal clavicle resection, mini open rotator cuff repair;  Surgeon: Valeria Batman, MD;  Location: [facility];  Service: Orthopedics;  Laterality: Left;   TONSILLECTOMY     Patient Active Problem List   Diagnosis Date Noted   S/P left rotator cuff repair 01/27/2019   Pain in left shoulder 11/28/2018   Encounter for screening colonoscopy 08/14/2017   Alcohol abuse, daily use 08/14/2017   GERD (gastroesophageal reflux disease) 08/29/2010   Hx of adenomatous colonic polyps 08/29/2010   FHx: colon cancer 08/29/2010    PCP: Carylon Perches, MD   REFERRING PROVIDER: Tressie Stalker, MD  REFERRING DIAG: [phone] (ICD-10-CM) - Spinal stenosis, lumbar region without neurogenic claudication  Rationale for Evaluation and Treatment: Rehabilitation  THERAPY DIAG:  Lumbar stenosis without neurogenic claudication  Other low back pain  Muscle weakness (generalized)  ONSET DATE: chronic  SUBJECTIVE:  SUBJECTIVE STATEMENT: Has been dealing with chronic back pain for years due to spinal stenosis   PERTINENT HISTORY:    PAIN:  Are you having pain? Yes: NPRS scale: 8/10 Pain location: low back Pain description: ache Aggravating factors: prolonged standing/walking Relieving factors: position changes, sitting  PRECAUTIONS: Other: lumbar stenosis  WEIGHT BEARING RESTRICTIONS:  No  FALLS:  Has patient fallen in last 6 months? No  OCCUPATION: retired  PLOF: Independent  PATIENT GOALS: To reduce and manage my low back pain  NEXT MD VISIT: July 24th  OBJECTIVE:   DIAGNOSTIC FINDINGS:  IMPRESSION: 1. At L3-L4, progressive moderate to severe canal stenosis. Mild bilateral foraminal stenosis. 2. At L4-L5, 5 mm left discogenic cyst with severe left subarticular recess stenosis and progressive moderate central canal stenosis. Mild bilateral foraminal stenosis.     Electronically Signed   By: Feliberto Harts M.D.   On: 07/08/2022 15:29    PATIENT SURVEYS:  FOTO 51(62 predicted)  SCREENING FOR RED FLAGS: Bowel or bladder incontinence: No   MUSCLE LENGTH: Hamstrings: Right 80 deg; Left 80 deg   POSTURE: rounded shoulders, forward head, decreased lumbar lordosis, and increased thoracic kyphosis   LUMBAR ROM:   AROM eval  Flexion   Extension   Right lateral flexion   Left lateral flexion   Right rotation   Left rotation    (Blank rows = not tested)  LOWER EXTREMITY ROM:     Active  Right eval Left eval  Hip flexion    Hip extension    Hip abduction    Hip adduction    Hip internal rotation    Hip external rotation    Knee flexion    Knee extension    Ankle dorsiflexion    Ankle plantarflexion    Ankle inversion    Ankle eversion     (Blank rows = not tested)  LOWER EXTREMITY MMT:    MMT Right eval Left eval  Hip flexion 4- 4-  Hip extension 4- 4-  Hip abduction 4- 4-  Hip adduction    Hip internal rotation    Hip external rotation    Knee flexion 4- 4-  Knee extension 4- 4-  Ankle dorsiflexion    Ankle plantarflexion 4- 4-  Ankle inversion    Ankle eversion     (Blank rows = not tested)  LUMBAR SPECIAL TESTS:  Straight leg raise test: Negative and Slump test: Negative  FUNCTIONAL TESTS:  5 times sit to stand: 25s arms crossed 30 seconds chair stand test N/T  GAIT: Distance walked: 22ft x2 Assistive  device utilized: None Level of assistance: Complete Independence Comments: flexed posture  TODAY'S TREATMENT:                                                                                                                              DATE: 08/30/22 Eval and HEP    PATIENT EDUCATION:  Education details: Discussed eval findings, rehab rationale  and POC and patient is in agreement  Person educated: Patient Education method: Explanation Education comprehension: needs further education  HOME EXERCISE PROGRAM: Access Code: [email or web address] URL: [email or web address] Date: 08/30/2022 Prepared by: Gustavus Bryant  Exercises - Supine Posterior Pelvic Tilt  - 2 x daily - 5 x weekly - 2 sets - 10 reps - Sit to Stand with Arms Crossed  - 2 x daily - 5 x weekly - 2 sets - Single Leg Heel Raise with Counter Support  - 2 x daily - 5 x weekly - 2 sets - 10 reps  ASSESSMENT:  CLINICAL IMPRESSION: Patient is a 67 y.o. male who was seen today for physical therapy evaluation and treatment for chronic low back pain. Symptoms onset following prolonged standing and walking and resolve completely when seated.  He denies radicular symptoms.  He was recommended to undergo surgical correction but requires trial of OPPT prior to surgical authorization.  He has had prior ESI with minimal relief.   OBJECTIVE IMPAIRMENTS: decreased activity tolerance, decreased endurance, decreased mobility, difficulty walking, decreased ROM, decreased strength, impaired perceived functional ability, postural dysfunction, and pain.   ACTIVITY LIMITATIONS: carrying, lifting, bending, sitting, standing, and squatting  PERSONAL FACTORS: Age, Fitness, Time since onset of injury/illness/exacerbation, and 1 comorbidity: stenosis and DM  are also affecting patient's functional outcome.   REHAB POTENTIAL: Good  CLINICAL DECISION MAKING: Evolving/moderate complexity  EVALUATION COMPLEXITY: Low   GOALS: Goals reviewed with  patient? No  SHORT TERM GOALS: Target date: 10/25/2022  Patient to demonstrate independence in HEP  Baseline: [id number] Goal status: INITIAL  2.  Decrease 5x STS time to 20s arms crossed Baseline: 25s arms crossed Goal status: INITIAL  3.  Increase BLE strength to 4/5 Baseline:  MMT Right eval Left eval  Hip flexion 4- 4-  Hip extension 4- 4-  Hip abduction 4- 4-  Hip adduction    Hip internal rotation    Hip external rotation    Knee flexion 4- 4-  Knee extension 4- 4-  Ankle dorsiflexion    Ankle plantarflexion 4- 4-   Goal status: INITIAL  4.  Increase FOTO score to 62 Baseline: 51 Goal status: INITIAL   PLAN:  PT FREQUENCY: 1x/week  PT DURATION: 6 weeks  PLANNED INTERVENTIONS: Therapeutic exercises, Therapeutic activity, Neuromuscular re-education, Balance training, Gait training, Patient/Family education, Self Care, Joint mobilization, Aquatic Therapy, Dry Needling, Electrical stimulation, Spinal mobilization, Cryotherapy, Moist heat, Manual therapy, and Re-evaluation.  PLAN FOR NEXT SESSION: HEP review and update, manual techniques as appropriate, aerobic tasks, ROM and flexibility activities, strengthening and PREs, TPDN, gait and balance training as needed     Hildred Laser, PT 08/30/2022, 5:53 PM

## 2022-08-30 ENCOUNTER — Ambulatory Visit: Payer: 59 | Attending: Neurosurgery

## 2022-08-30 ENCOUNTER — Other Ambulatory Visit: Payer: Self-pay

## 2022-08-30 DIAGNOSIS — M6281 Muscle weakness (generalized): Secondary | ICD-10-CM | POA: Diagnosis not present

## 2022-08-30 DIAGNOSIS — M48061 Spinal stenosis, lumbar region without neurogenic claudication: Secondary | ICD-10-CM | POA: Diagnosis not present

## 2022-08-30 DIAGNOSIS — M5459 Other low back pain: Secondary | ICD-10-CM | POA: Insufficient documentation

## 2022-09-03 ENCOUNTER — Other Ambulatory Visit (HOSPITAL_COMMUNITY): Payer: Self-pay

## 2022-09-06 ENCOUNTER — Other Ambulatory Visit (HOSPITAL_COMMUNITY): Payer: Self-pay

## 2022-09-17 ENCOUNTER — Ambulatory Visit (HOSPITAL_COMMUNITY): Payer: 59 | Admitting: Physical Therapy

## 2022-09-18 NOTE — Therapy (Unsigned)
OUTPATIENT PHYSICAL THERAPY THORACOLUMBAR EVALUATION   Patient Name: Louis Mccullough MRN: [id number] DOB:03/30/1955, 67 y.o., male Today's Date: 09/20/2022  END OF SESSION:  PT End of Session - 09/20/22 1608     Visit Number 2    Number of Visits 6    Date for PT Re-Evaluation 10/25/22    Authorization Type [facility] [employer]    PT Start Time 1610    PT Stop Time 1648    PT Time Calculation (min) 38 min    Activity Tolerance Patient tolerated treatment well    Behavior During Therapy WFL for tasks assessed/performed             Past Medical History:  Diagnosis Date   GERD (gastroesophageal reflux disease)    states he had previous EGD   Gout    History of bronchitis    HTN (hypertension)    Hx of adenomatous colonic polyps    Hyperlipidemia    Hypertriglyceridemia    Hypothyroidism    Seasonal allergies    Shoulder impingement, left    Wears glasses    Past Surgical History:  Procedure Laterality Date   BILATERAL CARPAL TUNNEL RELEASE Bilateral 02/12/2014   Procedure: BILATERAL CARPAL TUNNEL RELEASE;  Surgeon: Cindee Salt, MD;  Location: [facility];  Service: Orthopedics;  Laterality: Bilateral;   CHOLECYSTECTOMY     COLONOSCOPY  03/12/2004   adenomatous polyp, diverticulosis   COLONOSCOPY  09/29/2010   Procedure: COLONOSCOPY;  Surgeon: Corbin Ade, MD;  [facility];  Service: Endoscopy;  Laterality: N/A;   COLONOSCOPY WITH PROPOFOL N/A 10/24/2017   Procedure: COLONOSCOPY WITH PROPOFOL;  Surgeon: Corbin Ade, MD;  [facility];  Service: Endoscopy;  Laterality: N/A;  11:00am   COLONOSCOPY WITH PROPOFOL N/A 04/07/2021   Procedure: COLONOSCOPY WITH PROPOFOL;  Surgeon: Corbin Ade, MD;  [facility];  Service: Endoscopy;  Laterality: N/A;  7:30am   NECK SURGERY  03/13/1999   cerv fusion   POLYPECTOMY  10/24/2017   Procedure: POLYPECTOMY;  Surgeon: Corbin Ade, MD;  [facility];  Service: Endoscopy;;   splenic flexure and hepatic flexure, descending colon   POLYPECTOMY  04/07/2021   Procedure: POLYPECTOMY;  Surgeon: Corbin Ade, MD;  [facility];  Service: Endoscopy;;   ROTATOR CUFF REPAIR Right    ROTATOR CUFF REPAIR Left    SHOULDER ARTHROSCOPY WITH SUBACROMIAL DECOMPRESSION, ROTATOR CUFF REPAIR AND BICEP TENDON REPAIR Left 01/20/2019   Procedure: left shoulder arthroscopy, subacromial decompression, distal clavicle resection, mini open rotator cuff repair;  Surgeon: Valeria Batman, MD;  [facility];  Service: Orthopedics;  Laterality: Left;   TONSILLECTOMY     Patient Active Problem List   Diagnosis Date Noted   S/P left rotator cuff repair 01/27/2019   Pain in left shoulder 11/28/2018   Encounter for screening colonoscopy 08/14/2017   Alcohol abuse, daily use 08/14/2017   GERD (gastroesophageal reflux disease) 08/29/2010   Hx of adenomatous colonic polyps 08/29/2010   FHx: colon cancer 08/29/2010    PCP: Carylon Perches, MD   REFERRING PROVIDER: Tressie Stalker, MD  REFERRING DIAG: [phone] (ICD-10-CM) - Spinal stenosis, lumbar region without neurogenic claudication  Rationale for Evaluation and Treatment: Rehabilitation  THERAPY DIAG:  Lumbar stenosis without neurogenic claudication  Other low back pain  Muscle weakness (generalized)  ONSET DATE: chronic  SUBJECTIVE:  SUBJECTIVE STATEMENT: No changes to report since last session.  Pain initiates when patient begins walking rated at 7/10.  Patient is limited to 30 min oc activity including ambulation and mowing.  PERTINENT HISTORY:    PAIN:  Are you having pain? Yes: NPRS scale: 8/10 Pain location: low back Pain description: ache Aggravating factors: prolonged standing/walking Relieving  factors: position changes, sitting  PRECAUTIONS: Other: lumbar stenosis  WEIGHT BEARING RESTRICTIONS: No  FALLS:  Has patient fallen in last 6 months? No  OCCUPATION: retired  PLOF: Independent  PATIENT GOALS: To reduce and manage my low back pain  NEXT MD VISIT: July 24th  OBJECTIVE:   DIAGNOSTIC FINDINGS:  IMPRESSION: 1. At L3-L4, progressive moderate to severe canal stenosis. Mild bilateral foraminal stenosis. 2. At L4-L5, 5 mm left discogenic cyst with severe left subarticular recess stenosis and progressive moderate central canal stenosis. Mild bilateral foraminal stenosis.     Electronically Signed   By: Feliberto Harts M.D.   On: 07/08/2022 15:29    PATIENT SURVEYS:  FOTO 51(62 predicted)  SCREENING FOR RED FLAGS: Bowel or bladder incontinence: No   MUSCLE LENGTH: Hamstrings: Right 80 deg; Left 80 deg   POSTURE: rounded shoulders, forward head, decreased lumbar lordosis, and increased thoracic kyphosis   LUMBAR ROM:   AROM eval  Flexion   Extension   Right lateral flexion   Left lateral flexion   Right rotation   Left rotation    (Blank rows = not tested)  LOWER EXTREMITY ROM:     Active  Right eval Left eval  Hip flexion    Hip extension    Hip abduction    Hip adduction    Hip internal rotation    Hip external rotation    Knee flexion    Knee extension    Ankle dorsiflexion    Ankle plantarflexion    Ankle inversion    Ankle eversion     (Blank rows = not tested)  LOWER EXTREMITY MMT:    MMT Right eval Left eval  Hip flexion 4- 4-  Hip extension 4- 4-  Hip abduction 4- 4-  Hip adduction    Hip internal rotation    Hip external rotation    Knee flexion 4- 4-  Knee extension 4- 4-  Ankle dorsiflexion    Ankle plantarflexion 4- 4-  Ankle inversion    Ankle eversion     (Blank rows = not tested)  LUMBAR SPECIAL TESTS:  Straight leg raise test: Negative and Slump test: Negative  FUNCTIONAL TESTS:  5 times sit to  stand: 25s arms crossed 30 seconds chair stand test N/T  GAIT: Distance walked: 71ft x2 Assistive device utilized: None Level of assistance: Complete Independence Comments: flexed posture  TODAY'S TREATMENT:       [facility] Adult PT Treatment:                                                DATE: 09/20/22 Therapeutic Exercise: Nustep L2  Seated hamstring stretch 30s x2 B Supine QL stretch 30s x2 B Curl ups L1 10x  PPT 3s hold 10x PPT with alternating march 10/10 PPT with alternating OH flexion  DATE: 08/30/22 Eval and HEP    PATIENT EDUCATION:  Education details: Discussed eval findings, rehab rationale and POC and patient is in agreement  Person educated: Patient Education method: Explanation Education comprehension: needs further education  HOME EXERCISE PROGRAM: Access Code: [email or web address] URL: [email or web address] Date: 08/30/2022 Prepared by: Gustavus Bryant  Exercises - Supine Posterior Pelvic Tilt  - 2 x daily - 5 x weekly - 2 sets - 10 reps - Sit to Stand with Arms Crossed  - 2 x daily - 5 x weekly - 2 sets - Single Leg Heel Raise with Counter Support  - 2 x daily - 5 x weekly - 2 sets - 10 reps  ASSESSMENT:  CLINICAL IMPRESSION: Patient returns for first f/u session with unchanging symptoms.  Pain onsets with activities where he is required to stand and be on his feet for prolonged periods including walking and mowing.  Todays session focused on aerobic work, stretching and core strengthening to reduce pressure and stress on lumbar spine.     Patient is a 67 y.o. male who was seen today for physical therapy evaluation and treatment for chronic low back pain. Symptoms onset following prolonged standing and walking and resolve completely when seated.  He denies radicular symptoms.  He was recommended to undergo surgical correction but requires  trial of OPPT prior to surgical authorization.  He has had prior ESI with minimal relief.   OBJECTIVE IMPAIRMENTS: decreased activity tolerance, decreased endurance, decreased mobility, difficulty walking, decreased ROM, decreased strength, impaired perceived functional ability, postural dysfunction, and pain.   ACTIVITY LIMITATIONS: carrying, lifting, bending, sitting, standing, and squatting  PERSONAL FACTORS: Age, Fitness, Time since onset of injury/illness/exacerbation, and 1 comorbidity: stenosis and DM  are also affecting patient's functional outcome.   REHAB POTENTIAL: Good  CLINICAL DECISION MAKING: Evolving/moderate complexity  EVALUATION COMPLEXITY: Low   GOALS: Goals reviewed with patient? No  SHORT TERM GOALS=LONG TERM GOALS: Target date: 10/25/2022  Patient to demonstrate independence in HEP  Baseline: [id number] Goal status: INITIAL  2.  Decrease 5x STS time to 20s arms crossed Baseline: 25s arms crossed Goal status: INITIAL  3.  Increase BLE strength to 4/5 Baseline:  MMT Right eval Left eval  Hip flexion 4- 4-  Hip extension 4- 4-  Hip abduction 4- 4-  Hip adduction    Hip internal rotation    Hip external rotation    Knee flexion 4- 4-  Knee extension 4- 4-  Ankle dorsiflexion    Ankle plantarflexion 4- 4-   Goal status: INITIAL  4.  Increase FOTO score to 62 Baseline: 51 Goal status: INITIAL   PLAN:  PT FREQUENCY: 1x/week  PT DURATION: 6 weeks  PLANNED INTERVENTIONS: Therapeutic exercises, Therapeutic activity, Neuromuscular re-education, Balance training, Gait training, Patient/Family education, Self Care, Joint mobilization, Aquatic Therapy, Dry Needling, Electrical stimulation, Spinal mobilization, Cryotherapy, Moist heat, Manual therapy, and Re-evaluation.  PLAN FOR NEXT SESSION: HEP review and update, manual techniques as appropriate, aerobic tasks, ROM and flexibility activities, strengthening and PREs, TPDN, gait and balance training as  needed     Hildred Laser, PT 09/20/2022, 4:56 PM

## 2022-09-20 ENCOUNTER — Ambulatory Visit: Payer: 59 | Attending: Neurosurgery

## 2022-09-20 DIAGNOSIS — M5459 Other low back pain: Secondary | ICD-10-CM | POA: Insufficient documentation

## 2022-09-20 DIAGNOSIS — M6281 Muscle weakness (generalized): Secondary | ICD-10-CM | POA: Diagnosis not present

## 2022-09-20 DIAGNOSIS — M48061 Spinal stenosis, lumbar region without neurogenic claudication: Secondary | ICD-10-CM | POA: Insufficient documentation

## 2022-09-21 NOTE — Therapy (Signed)
OUTPATIENT PHYSICAL THERAPY THORACOLUMBAR EVALUATION   Patient Name: Louis Mccullough MRN: [id number] DOB:10-23-1955, 67 y.o., male Today's Date: 09/24/2022  END OF SESSION:  PT End of Session - 09/24/22 1448     Visit Number 3    Date for PT Re-Evaluation 10/25/22    Authorization Type [facility] [employer]              Past Medical History:  Diagnosis Date   GERD (gastroesophageal reflux disease)    states he had previous EGD   Gout    History of bronchitis    HTN (hypertension)    Hx of adenomatous colonic polyps    Hyperlipidemia    Hypertriglyceridemia    Hypothyroidism    Seasonal allergies    Shoulder impingement, left    Wears glasses    Past Surgical History:  Procedure Laterality Date   BILATERAL CARPAL TUNNEL RELEASE Bilateral 02/12/2014   Procedure: BILATERAL CARPAL TUNNEL RELEASE;  Surgeon: Cindee Salt, MD;  Location: [facility];  Service: Orthopedics;  Laterality: Bilateral;   CHOLECYSTECTOMY     COLONOSCOPY  03/12/2004   adenomatous polyp, diverticulosis   COLONOSCOPY  09/29/2010   Procedure: COLONOSCOPY;  Surgeon: Corbin Ade, MD;  [facility];  Service: Endoscopy;  Laterality: N/A;   COLONOSCOPY WITH PROPOFOL N/A 10/24/2017   Procedure: COLONOSCOPY WITH PROPOFOL;  Surgeon: Corbin Ade, MD;  [facility];  Service: Endoscopy;  Laterality: N/A;  11:00am   COLONOSCOPY WITH PROPOFOL N/A 04/07/2021   Procedure: COLONOSCOPY WITH PROPOFOL;  Surgeon: Corbin Ade, MD;  [facility];  Service: Endoscopy;  Laterality: N/A;  7:30am   NECK SURGERY  03/13/1999   cerv fusion   POLYPECTOMY  10/24/2017   Procedure: POLYPECTOMY;  Surgeon: Corbin Ade, MD;  [facility];  Service: Endoscopy;;  splenic flexure and hepatic flexure, descending colon   POLYPECTOMY  04/07/2021   Procedure: POLYPECTOMY;  Surgeon: Corbin Ade, MD;  [facility];  Service: Endoscopy;;   ROTATOR CUFF REPAIR Right     ROTATOR CUFF REPAIR Left    SHOULDER ARTHROSCOPY WITH SUBACROMIAL DECOMPRESSION, ROTATOR CUFF REPAIR AND BICEP TENDON REPAIR Left 01/20/2019   Procedure: left shoulder arthroscopy, subacromial decompression, distal clavicle resection, mini open rotator cuff repair;  Surgeon: Valeria Batman, MD;  [facility];  Service: Orthopedics;  Laterality: Left;   TONSILLECTOMY     Patient Active Problem List   Diagnosis Date Noted   S/P left rotator cuff repair 01/27/2019   Pain in left shoulder 11/28/2018   Encounter for screening colonoscopy 08/14/2017   Alcohol abuse, daily use 08/14/2017   GERD (gastroesophageal reflux disease) 08/29/2010   Hx of adenomatous colonic polyps 08/29/2010   FHx: colon cancer 08/29/2010    PCP: Carylon Perches, MD   REFERRING PROVIDER: Tressie Stalker, MD  REFERRING DIAG: [phone] (ICD-10-CM) - Spinal stenosis, lumbar region without neurogenic claudication  Rationale for Evaluation and Treatment: Rehabilitation  THERAPY DIAG:  Lumbar stenosis without neurogenic claudication  Other low back pain  Muscle weakness (generalized)  ONSET DATE: chronic  SUBJECTIVE:  SUBJECTIVE STATEMENT: No changes to report since last session.  Pain initiates when patient begins walking rated at 7/10.  Patient is limited to 30 min oc activity including ambulation and mowing.  PERTINENT HISTORY:    PAIN:  Are you having pain? Yes: NPRS scale: 8/10 Pain location: low back Pain description: ache Aggravating factors: prolonged standing/walking Relieving factors: position changes, sitting  PRECAUTIONS: Other: lumbar stenosis  WEIGHT BEARING RESTRICTIONS: No  FALLS:  Has patient fallen in last 6 months? No  OCCUPATION: retired  PLOF: Independent  PATIENT GOALS:  To reduce and manage my low back pain  NEXT MD VISIT: July 24th  OBJECTIVE:   DIAGNOSTIC FINDINGS:  IMPRESSION: 1. At L3-L4, progressive moderate to severe canal stenosis. Mild bilateral foraminal stenosis. 2. At L4-L5, 5 mm left discogenic cyst with severe left subarticular recess stenosis and progressive moderate central canal stenosis. Mild bilateral foraminal stenosis.     Electronically Signed   By: Feliberto Harts M.D.   On: 07/08/2022 15:29    PATIENT SURVEYS:  FOTO 51(62 predicted)  SCREENING FOR RED FLAGS: Bowel or bladder incontinence: No   MUSCLE LENGTH: Hamstrings: Right 80 deg; Left 80 deg   POSTURE: rounded shoulders, forward head, decreased lumbar lordosis, and increased thoracic kyphosis   LUMBAR ROM:   AROM eval  Flexion   Extension   Right lateral flexion   Left lateral flexion   Right rotation   Left rotation    (Blank rows = not tested)  LOWER EXTREMITY ROM:     Active  Right eval Left eval  Hip flexion    Hip extension    Hip abduction    Hip adduction    Hip internal rotation    Hip external rotation    Knee flexion    Knee extension    Ankle dorsiflexion    Ankle plantarflexion    Ankle inversion    Ankle eversion     (Blank rows = not tested)  LOWER EXTREMITY MMT:    MMT Right eval Left eval  Hip flexion 4- 4-  Hip extension 4- 4-  Hip abduction 4- 4-  Hip adduction    Hip internal rotation    Hip external rotation    Knee flexion 4- 4-  Knee extension 4- 4-  Ankle dorsiflexion    Ankle plantarflexion 4- 4-  Ankle inversion    Ankle eversion     (Blank rows = not tested)  LUMBAR SPECIAL TESTS:  Straight leg raise test: Negative and Slump test: Negative  FUNCTIONAL TESTS:  5 times sit to stand: 25s arms crossed 30 seconds chair stand test N/T  GAIT: Distance walked: 57ft x2 Assistive device utilized: None Level of assistance: Complete Independence Comments: flexed posture  TODAY'S TREATMENT:     [facility] Adult PT Treatment:                                                DATE: 09/24/22 Therapeutic Exercise: Nustep L3 8 min Seated hamstring stretch 30s x2 B Supine QL stretch 30s x2 B Supine hip flexor stretch 30s x2 B  Curl ups L1 15x  PPT 3s hold 10x PPT with alternating march 10/10 2# STS 10x   [facility] Adult PT Treatment:  DATE: 09/20/22 Therapeutic Exercise: Nustep L2  Seated hamstring stretch 30s x2 B Supine QL stretch 30s x2 B Curl ups L1 10x  PPT 3s hold 10x PPT with alternating march 10/10 PPT with alternating OH flexion                                                                                                                          DATE: 08/30/22 Eval and HEP    PATIENT EDUCATION:  Education details: Discussed eval findings, rehab rationale and POC and patient is in agreement  Person educated: Patient Education method: Explanation Education comprehension: needs further education  HOME EXERCISE PROGRAM: Access Code: [email or web address] URL: [email or web address] Date: 08/30/2022 Prepared by: Gustavus Bryant  Exercises - Supine Posterior Pelvic Tilt  - 2 x daily - 5 x weekly - 2 sets - 10 reps - Sit to Stand with Arms Crossed  - 2 x daily - 5 x weekly - 2 sets - Single Leg Heel Raise with Counter Support  - 2 x daily - 5 x weekly - 2 sets - 10 reps  ASSESSMENT:  CLINICAL IMPRESSION: Returns to OPPT w/ any increase in symptoms following last session.  Overall symptoms and pain remain unchanged.  Advanced challenge and difficulty as noted to stress core musculature.  Exercise selection mindful of hernia and physiologic limitations   Patient is a 68 y.o. male who was seen today for physical therapy evaluation and treatment for chronic low back pain. Symptoms onset following prolonged standing and walking and resolve completely when seated.  He denies radicular symptoms.  He was recommended to undergo surgical  correction but requires trial of OPPT prior to surgical authorization.  He has had prior ESI with minimal relief.   OBJECTIVE IMPAIRMENTS: decreased activity tolerance, decreased endurance, decreased mobility, difficulty walking, decreased ROM, decreased strength, impaired perceived functional ability, postural dysfunction, and pain.   ACTIVITY LIMITATIONS: carrying, lifting, bending, sitting, standing, and squatting  PERSONAL FACTORS: Age, Fitness, Time since onset of injury/illness/exacerbation, and 1 comorbidity: stenosis and DM  are also affecting patient's functional outcome.   REHAB POTENTIAL: Good  CLINICAL DECISION MAKING: Evolving/moderate complexity  EVALUATION COMPLEXITY: Low   GOALS: Goals reviewed with patient? No  SHORT TERM GOALS=LONG TERM GOALS: Target date: 10/25/2022  Patient to demonstrate independence in HEP  Baseline: [id number] Goal status: INITIAL  2.  Decrease 5x STS time to 20s arms crossed Baseline: 25s arms crossed Goal status: INITIAL  3.  Increase BLE strength to 4/5 Baseline:  MMT Right eval Left eval  Hip flexion 4- 4-  Hip extension 4- 4-  Hip abduction 4- 4-  Hip adduction    Hip internal rotation    Hip external rotation    Knee flexion 4- 4-  Knee extension 4- 4-  Ankle dorsiflexion    Ankle plantarflexion 4- 4-   Goal status: INITIAL  4.  Increase FOTO score to 62 Baseline: 51 Goal status: INITIAL   PLAN:  PT FREQUENCY: 1x/week  PT DURATION: 6 weeks  PLANNED INTERVENTIONS: Therapeutic exercises, Therapeutic activity, Neuromuscular re-education, Balance training, Gait training, Patient/Family education, Self Care, Joint mobilization, Aquatic Therapy, Dry Needling, Electrical stimulation, Spinal mobilization, Cryotherapy, Moist heat, Manual therapy, and Re-evaluation.  PLAN FOR NEXT SESSION: HEP review and update, manual techniques as appropriate, aerobic tasks, ROM and flexibility activities, strengthening and PREs, TPDN, gait  and balance training as needed     Hildred Laser, PT 09/24/2022, 3:30 PM

## 2022-09-24 ENCOUNTER — Ambulatory Visit: Payer: 59

## 2022-09-24 DIAGNOSIS — M48061 Spinal stenosis, lumbar region without neurogenic claudication: Secondary | ICD-10-CM

## 2022-09-24 DIAGNOSIS — M5459 Other low back pain: Secondary | ICD-10-CM

## 2022-09-24 DIAGNOSIS — M6281 Muscle weakness (generalized): Secondary | ICD-10-CM | POA: Diagnosis not present

## 2022-09-26 DIAGNOSIS — E039 Hypothyroidism, unspecified: Secondary | ICD-10-CM | POA: Diagnosis not present

## 2022-09-26 DIAGNOSIS — E785 Hyperlipidemia, unspecified: Secondary | ICD-10-CM | POA: Diagnosis not present

## 2022-09-26 DIAGNOSIS — Z125 Encounter for screening for malignant neoplasm of prostate: Secondary | ICD-10-CM | POA: Diagnosis not present

## 2022-09-26 DIAGNOSIS — Z79899 Other long term (current) drug therapy: Secondary | ICD-10-CM | POA: Diagnosis not present

## 2022-09-26 DIAGNOSIS — M1000 Idiopathic gout, unspecified site: Secondary | ICD-10-CM | POA: Diagnosis not present

## 2022-09-26 DIAGNOSIS — I10 Essential (primary) hypertension: Secondary | ICD-10-CM | POA: Diagnosis not present

## 2022-09-26 DIAGNOSIS — E1129 Type 2 diabetes mellitus with other diabetic kidney complication: Secondary | ICD-10-CM | POA: Diagnosis not present

## 2022-09-26 DIAGNOSIS — M5186 Other intervertebral disc disorders, lumbar region: Secondary | ICD-10-CM | POA: Diagnosis not present

## 2022-10-01 ENCOUNTER — Ambulatory Visit: Payer: 59

## 2022-10-02 ENCOUNTER — Other Ambulatory Visit (HOSPITAL_COMMUNITY): Payer: Self-pay

## 2022-10-03 ENCOUNTER — Other Ambulatory Visit (HOSPITAL_COMMUNITY): Payer: Self-pay

## 2022-10-03 MED ORDER — METFORMIN HCL 500 MG PO TABS
500.0000 mg | ORAL_TABLET | Freq: Two times a day (BID) | ORAL | 4 refills | Status: DC
  Filled 2022-10-03: qty 180, 90d supply, fill #0
  Filled 2023-01-01: qty 180, 90d supply, fill #1
  Filled 2023-03-28: qty 180, 90d supply, fill #2
  Filled 2023-06-26: qty 180, 90d supply, fill #3
  Filled 2023-09-27: qty 180, 90d supply, fill #4

## 2022-10-03 NOTE — Therapy (Addendum)
OUTPATIENT PHYSICAL THERAPY TREATMENT NOTE/DC SUMMARY   Patient Name: Louis Mccullough MRN: [id number] DOB:June 02, 1955, 67 y.o., male Today's Date: 10/15/2022 PHYSICAL THERAPY DISCHARGE SUMMARY  Visits from Start of Care: 4  Current functional level related to goals / functional outcomes: Partially met   Remaining deficits: pain   Education / Equipment: HEP   Patient agrees to discharge. Patient goals were partially met. Patient is being discharged due to the physician's request.  END OF SESSION:      Past Medical History:  Diagnosis Date   GERD (gastroesophageal reflux disease)    states he had previous EGD   Gout    History of bronchitis    HTN (hypertension)    Hx of adenomatous colonic polyps    Hyperlipidemia    Hypertriglyceridemia    Hypothyroidism    Seasonal allergies    Shoulder impingement, left    Wears glasses    Past Surgical History:  Procedure Laterality Date   BILATERAL CARPAL TUNNEL RELEASE Bilateral 02/12/2014   Procedure: BILATERAL CARPAL TUNNEL RELEASE;  Surgeon: Cindee Salt, MD;  Location: [facility];  Service: Orthopedics;  Laterality: Bilateral;   CHOLECYSTECTOMY     COLONOSCOPY  03/12/2004   adenomatous polyp, diverticulosis   COLONOSCOPY  09/29/2010   Procedure: COLONOSCOPY;  Surgeon: Corbin Ade, MD;  [facility];  Service: Endoscopy;  Laterality: N/A;   COLONOSCOPY WITH PROPOFOL N/A 10/24/2017   Procedure: COLONOSCOPY WITH PROPOFOL;  Surgeon: Corbin Ade, MD;  [facility];  Service: Endoscopy;  Laterality: N/A;  11:00am   COLONOSCOPY WITH PROPOFOL N/A 04/07/2021   Procedure: COLONOSCOPY WITH PROPOFOL;  Surgeon: Corbin Ade, MD;  [facility];  Service: Endoscopy;  Laterality: N/A;  7:30am   NECK SURGERY  03/13/1999   cerv fusion   POLYPECTOMY  10/24/2017   Procedure: POLYPECTOMY;  Surgeon: Corbin Ade, MD;  [facility];  Service: Endoscopy;;  splenic flexure and  hepatic flexure, descending colon   POLYPECTOMY  04/07/2021   Procedure: POLYPECTOMY;  Surgeon: Corbin Ade, MD;  [facility];  Service: Endoscopy;;   ROTATOR CUFF REPAIR Right    ROTATOR CUFF REPAIR Left    SHOULDER ARTHROSCOPY WITH SUBACROMIAL DECOMPRESSION, ROTATOR CUFF REPAIR AND BICEP TENDON REPAIR Left 01/20/2019   Procedure: left shoulder arthroscopy, subacromial decompression, distal clavicle resection, mini open rotator cuff repair;  Surgeon: Valeria Batman, MD;  Location: [facility];  Service: Orthopedics;  Laterality: Left;   TONSILLECTOMY     Patient Active Problem List   Diagnosis Date Noted   S/P left rotator cuff repair 01/27/2019   Pain in left shoulder 11/28/2018   Encounter for screening colonoscopy 08/14/2017   Alcohol abuse, daily use 08/14/2017   GERD (gastroesophageal reflux disease) 08/29/2010   Hx of adenomatous colonic polyps 08/29/2010   FHx: colon cancer 08/29/2010    PCP: Carylon Perches, MD   REFERRING PROVIDER: Tressie Stalker, MD  REFERRING DIAG: [phone] (ICD-10-CM) - Spinal stenosis, lumbar region without neurogenic claudication  Rationale for Evaluation and Treatment: Rehabilitation  THERAPY DIAG:  Lumbar stenosis without neurogenic claudication  Other low back pain  Muscle weakness (generalized)  ONSET DATE: chronic  SUBJECTIVE:  SUBJECTIVE STATEMENT: Unable to tolerate mowing w/o need of frequent rest breaks due to elevated back pain.  Remain limited to ambulating 1/2 mile distances w/o need of of stopping due to marked low back pain.  Has also experience radicular symptoms into R hip when pain levels are elevated.    PERTINENT HISTORY:    PAIN:  Are you having pain? Yes: NPRS scale: 8/10 Pain location: low back Pain  description: ache Aggravating factors: prolonged standing/walking Relieving factors: position changes, sitting  PRECAUTIONS: Other: lumbar stenosis  WEIGHT BEARING RESTRICTIONS: No  FALLS:  Has patient fallen in last 6 months? No  OCCUPATION: retired  PLOF: Independent  PATIENT GOALS: To reduce and manage my low back pain  NEXT MD VISIT: July 24th  OBJECTIVE:   DIAGNOSTIC FINDINGS:  IMPRESSION: 1. At L3-L4, progressive moderate to severe canal stenosis. Mild bilateral foraminal stenosis. 2. At L4-L5, 5 mm left discogenic cyst with severe left subarticular recess stenosis and progressive moderate central canal stenosis. Mild bilateral foraminal stenosis.     Electronically Signed   By: Feliberto Harts M.D.   On: 07/08/2022 15:29    PATIENT SURVEYS:  FOTO 51(62 predicted)  SCREENING FOR RED FLAGS: Bowel or bladder incontinence: No   MUSCLE LENGTH: Hamstrings: Right 80 deg; Left 80 deg   POSTURE: rounded shoulders, forward head, decreased lumbar lordosis, and increased thoracic kyphosis   LUMBAR ROM:   AROM eval  Flexion   Extension   Right lateral flexion   Left lateral flexion   Right rotation   Left rotation    (Blank rows = not tested)  LOWER EXTREMITY ROM:   WFL for gait and mobility B  Active  Right eval Left eval  Hip flexion    Hip extension    Hip abduction    Hip adduction    Hip internal rotation    Hip external rotation    Knee flexion    Knee extension    Ankle dorsiflexion    Ankle plantarflexion    Ankle inversion    Ankle eversion     (Blank rows = not tested)  LOWER EXTREMITY MMT:    MMT Right eval Left eval  Hip flexion 4- 4-  Hip extension 4- 4-  Hip abduction 4- 4-  Hip adduction    Hip internal rotation    Hip external rotation    Knee flexion 4- 4-  Knee extension 4- 4-  Ankle dorsiflexion    Ankle plantarflexion 4- 4-  Ankle inversion    Ankle eversion     (Blank rows = not tested)  LUMBAR SPECIAL  TESTS:  Straight leg raise test: Negative and Slump test: Negative  FUNCTIONAL TESTS:  5 times sit to stand: 25s arms crossed 30 seconds chair stand test N/T 10/04/22 17s arms crossed  GAIT: Distance walked: 43ft x2 Assistive device utilized: None Level of assistance: Complete Independence Comments: flexed posture  TODAY'S TREATMENT:    [facility] Adult PT Treatment:                                                DATE: 10/04/22 Therapeutic Exercise: Nustep L4 8 min Seated hamstring stretch 30s x2 B Supine QL stretch 30s x2 B Supine hip flexor stretch 30s x2 B  Curl ups L2 15x  Supine hip fallouts BluTB 15x B, 15/15 unilaterally PPT with alternating  march 10/10 2# STS 5x arms crossed 17s  [facility] Adult PT Treatment:                                                DATE: 09/24/22 Therapeutic Exercise: Nustep L3 8 min Seated hamstring stretch 30s x2 B Supine QL stretch 30s x2 B Supine hip flexor stretch 30s x2 B  Curl ups L1 15x  PPT 3s hold 10x PPT with alternating march 10/10 2# STS 10x   [facility] Adult PT Treatment:                                                DATE: 09/20/22 Therapeutic Exercise: Nustep L2  Seated hamstring stretch 30s x2 B Supine QL stretch 30s x2 B Curl ups L1 10x  PPT 3s hold 10x PPT with alternating march 10/10 PPT with alternating OH flexion                                                                                                                          DATE: 08/30/22 Eval and HEP    PATIENT EDUCATION:  Education details: Discussed eval findings, rehab rationale and POC and patient is in agreement  Person educated: Patient Education method: Explanation Education comprehension: needs further education  HOME EXERCISE PROGRAM: Access Code: [email or web address] URL: [email or web address] Date: 08/30/2022 Prepared by: Gustavus Bryant  Exercises - Supine Posterior Pelvic Tilt  - 2 x daily - 5 x weekly - 2 sets - 10 reps - Sit to Stand with Arms  Crossed  - 2 x daily - 5 x weekly - 2 sets - Single Leg Heel Raise with Counter Support  - 2 x daily - 5 x weekly - 2 sets - 10 reps  ASSESSMENT:  CLINICAL IMPRESSION: Focus of today was assessment of progress pending MD f/u tomorrow.  Perceived functional ability as evicdenced by FOTO score is essentially unchanged.  Low back symptoms limit QOL tasks as patient is unable to ambulate greater than 10 min which limits his ability to tend to his property.  He is unable to mow his grass w/o need of multiple recovery periods due to elevated pain levels.  Gait pattern demonstrates an flexed posture to alleviate pain, adding additional stress to lumbar soft tissues and increasing his fall risk and COG has shifted forward to compensate for pain.  Increased resistance and difficulty of exercises today.  5x STS score improved but still greater than 15s indicating lower body and trunk weakness.  Minimal progress regarding pain and function noted over 4 weeks of OPPT.  Prognosis remains guarded due to underlying degenerative changes.   Patient is a 67 y.o. male who was seen today for physical therapy evaluation  and treatment for chronic low back pain. Symptoms onset following prolonged standing and walking and resolve completely when seated.  He denies radicular symptoms.  He was recommended to undergo surgical correction but requires trial of OPPT prior to surgical authorization.  He has had prior ESI with minimal relief.   OBJECTIVE IMPAIRMENTS: decreased activity tolerance, decreased endurance, decreased mobility, difficulty walking, decreased ROM, decreased strength, impaired perceived functional ability, postural dysfunction, and pain.   ACTIVITY LIMITATIONS: carrying, lifting, bending, sitting, standing, and squatting  PERSONAL FACTORS: Age, Fitness, Time since onset of injury/illness/exacerbation, and 1 comorbidity: stenosis and DM  are also affecting patient's functional outcome.   REHAB POTENTIAL:  Good  CLINICAL DECISION MAKING: Evolving/moderate complexity  EVALUATION COMPLEXITY: Low   GOALS: Goals reviewed with patient? No  SHORT TERM GOALS=LONG TERM GOALS: Target date: 10/25/2022  Patient to demonstrate independence in HEP  Baseline: [id number] Goal status: Met  2.  Decrease 5x STS time to 20s arms crossed Baseline: 25s arms crossed; 17s  Goal status: Met  3.  Increase BLE strength to 4/5 Baseline:  MMT Right eval Left eval  Hip flexion 4- 4-  Hip extension 4- 4-  Hip abduction 4- 4-  Hip adduction    Hip internal rotation    Hip external rotation    Knee flexion 4- 4-  Knee extension 4- 4-  Ankle dorsiflexion    Ankle plantarflexion 4- 4-   Goal status: INITIAL  4.  Increase FOTO score to 62 Baseline: 51; 10/04/22 53 Goal status: Ongoing   PLAN:  PT FREQUENCY: 1x/week  PT DURATION: 6 weeks  PLANNED INTERVENTIONS: Therapeutic exercises, Therapeutic activity, Neuromuscular re-education, Balance training, Gait training, Patient/Family education, Self Care, Joint mobilization, Aquatic Therapy, Dry Needling, Electrical stimulation, Spinal mobilization, Cryotherapy, Moist heat, Manual therapy, and Re-evaluation.  PLAN FOR NEXT SESSION: HEP review and update, manual techniques as appropriate, aerobic tasks, ROM and flexibility activities, strengthening and PREs, TPDN, gait and balance training as needed     Hildred Laser, PT 10/15/2022, 3:22 PM

## 2022-10-04 ENCOUNTER — Ambulatory Visit: Payer: 59

## 2022-10-04 DIAGNOSIS — M5459 Other low back pain: Secondary | ICD-10-CM | POA: Diagnosis not present

## 2022-10-04 DIAGNOSIS — M6281 Muscle weakness (generalized): Secondary | ICD-10-CM | POA: Diagnosis not present

## 2022-10-04 DIAGNOSIS — M48061 Spinal stenosis, lumbar region without neurogenic claudication: Secondary | ICD-10-CM | POA: Diagnosis not present

## 2022-10-05 DIAGNOSIS — M48061 Spinal stenosis, lumbar region without neurogenic claudication: Secondary | ICD-10-CM | POA: Diagnosis not present

## 2022-10-05 DIAGNOSIS — M48062 Spinal stenosis, lumbar region with neurogenic claudication: Secondary | ICD-10-CM | POA: Diagnosis not present

## 2022-10-08 ENCOUNTER — Ambulatory Visit: Payer: 59

## 2022-10-15 ENCOUNTER — Ambulatory Visit: Payer: 59

## 2022-10-22 ENCOUNTER — Ambulatory Visit: Payer: 59

## 2022-11-13 DIAGNOSIS — H5203 Hypermetropia, bilateral: Secondary | ICD-10-CM | POA: Diagnosis not present

## 2022-11-13 DIAGNOSIS — H2513 Age-related nuclear cataract, bilateral: Secondary | ICD-10-CM | POA: Diagnosis not present

## 2022-11-13 DIAGNOSIS — E119 Type 2 diabetes mellitus without complications: Secondary | ICD-10-CM | POA: Diagnosis not present

## 2022-11-27 ENCOUNTER — Other Ambulatory Visit (HOSPITAL_COMMUNITY): Payer: Self-pay

## 2022-11-27 ENCOUNTER — Other Ambulatory Visit: Payer: Self-pay

## 2022-11-27 MED ORDER — AMLODIPINE BESYLATE 10 MG PO TABS
10.0000 mg | ORAL_TABLET | Freq: Every day | ORAL | 4 refills | Status: DC
  Filled 2022-11-27: qty 90, 90d supply, fill #0
  Filled 2023-02-26: qty 90, 90d supply, fill #1
  Filled 2023-05-27: qty 90, 90d supply, fill #2
  Filled 2023-08-21: qty 90, 90d supply, fill #3
  Filled 2023-11-19: qty 90, 90d supply, fill #4

## 2022-11-27 MED ORDER — PANTOPRAZOLE SODIUM 40 MG PO TBEC
40.0000 mg | DELAYED_RELEASE_TABLET | Freq: Every day | ORAL | 4 refills | Status: DC
  Filled 2022-11-27: qty 90, 90d supply, fill #0
  Filled 2023-02-26: qty 90, 90d supply, fill #1
  Filled 2023-05-27: qty 90, 90d supply, fill #2
  Filled 2023-08-21: qty 90, 90d supply, fill #3
  Filled 2023-11-19: qty 90, 90d supply, fill #4

## 2022-11-27 MED ORDER — HYDROCHLOROTHIAZIDE 25 MG PO TABS
25.0000 mg | ORAL_TABLET | Freq: Every day | ORAL | 4 refills | Status: DC
  Filled 2022-11-27: qty 90, 90d supply, fill #0
  Filled 2023-02-26: qty 90, 90d supply, fill #1
  Filled 2023-05-27: qty 90, 90d supply, fill #2
  Filled 2023-08-21: qty 90, 90d supply, fill #3
  Filled 2023-11-19: qty 90, 90d supply, fill #4

## 2022-11-27 MED ORDER — ALLOPURINOL 300 MG PO TABS
300.0000 mg | ORAL_TABLET | Freq: Every day | ORAL | 4 refills | Status: DC
  Filled 2022-11-28: qty 90, 90d supply, fill #0
  Filled 2023-02-26: qty 90, 90d supply, fill #1
  Filled 2023-05-27: qty 90, 90d supply, fill #2
  Filled 2023-08-21: qty 90, 90d supply, fill #3
  Filled 2023-11-19: qty 90, 90d supply, fill #4

## 2022-11-27 MED ORDER — LEVOTHYROXINE SODIUM 112 MCG PO TABS
112.0000 ug | ORAL_TABLET | Freq: Every day | ORAL | 4 refills | Status: DC
  Filled 2022-11-27: qty 90, 90d supply, fill #0
  Filled 2023-02-26: qty 90, 90d supply, fill #1
  Filled 2023-05-27: qty 90, 90d supply, fill #2
  Filled 2023-08-21: qty 90, 90d supply, fill #3
  Filled 2023-11-19: qty 90, 90d supply, fill #4

## 2022-11-27 MED ORDER — TELMISARTAN 80 MG PO TABS
80.0000 mg | ORAL_TABLET | Freq: Every day | ORAL | 4 refills | Status: DC
  Filled 2022-11-27: qty 90, 90d supply, fill #0
  Filled 2023-02-26: qty 90, 90d supply, fill #1
  Filled 2023-05-27: qty 90, 90d supply, fill #2
  Filled 2023-08-21: qty 90, 90d supply, fill #3
  Filled 2023-11-19: qty 90, 90d supply, fill #4

## 2022-11-27 MED ORDER — SIMVASTATIN 20 MG PO TABS
20.0000 mg | ORAL_TABLET | Freq: Every day | ORAL | 4 refills | Status: DC
  Filled 2022-11-27: qty 90, 90d supply, fill #0
  Filled 2023-02-26: qty 90, 90d supply, fill #1
  Filled 2023-05-27: qty 90, 90d supply, fill #2
  Filled 2023-08-21: qty 90, 90d supply, fill #3
  Filled 2023-11-19: qty 90, 90d supply, fill #4

## 2022-11-28 ENCOUNTER — Other Ambulatory Visit (HOSPITAL_COMMUNITY): Payer: Self-pay

## 2022-12-17 NOTE — Therapy (Unsigned)
OUTPATIENT PHYSICAL THERAPY TREATMENT NOTE/DC SUMMARY   Patient Name: Louis Mccullough MRN: [id number] DOB:Nov 12, 1955, 67 y.o., male Today's Date: 12/17/2022 PHYSICAL THERAPY DISCHARGE SUMMARY  Visits from Start of Care: 4  Current functional level related to goals / functional outcomes: Partially met   Remaining deficits: pain   Education / Equipment: HEP   Patient agrees to discharge. Patient goals were partially met. Patient is being discharged due to the physician's request.  END OF SESSION:      Past Medical History:  Diagnosis Date   GERD (gastroesophageal reflux disease)    states he had previous EGD   Gout    History of bronchitis    HTN (hypertension)    Hx of adenomatous colonic polyps    Hyperlipidemia    Hypertriglyceridemia    Hypothyroidism    Seasonal allergies    Shoulder impingement, left    Wears glasses    Past Surgical History:  Procedure Laterality Date   BILATERAL CARPAL TUNNEL RELEASE Bilateral 02/12/2014   Procedure: BILATERAL CARPAL TUNNEL RELEASE;  Surgeon: Cindee Salt, MD;  Location: [facility];  Service: Orthopedics;  Laterality: Bilateral;   CHOLECYSTECTOMY     COLONOSCOPY  03/12/2004   adenomatous polyp, diverticulosis   COLONOSCOPY  09/29/2010   Procedure: COLONOSCOPY;  Surgeon: Corbin Ade, MD;  [facility];  Service: Endoscopy;  Laterality: N/A;   COLONOSCOPY WITH PROPOFOL N/A 10/24/2017   Procedure: COLONOSCOPY WITH PROPOFOL;  Surgeon: Corbin Ade, MD;  [facility];  Service: Endoscopy;  Laterality: N/A;  11:00am   COLONOSCOPY WITH PROPOFOL N/A 04/07/2021   Procedure: COLONOSCOPY WITH PROPOFOL;  Surgeon: Corbin Ade, MD;  [facility];  Service: Endoscopy;  Laterality: N/A;  7:30am   NECK SURGERY  03/13/1999   cerv fusion   POLYPECTOMY  10/24/2017   Procedure: POLYPECTOMY;  Surgeon: Corbin Ade, MD;  [facility];  Service: Endoscopy;;  splenic flexure and  hepatic flexure, descending colon   POLYPECTOMY  04/07/2021   Procedure: POLYPECTOMY;  Surgeon: Corbin Ade, MD;  [facility];  Service: Endoscopy;;   ROTATOR CUFF REPAIR Right    ROTATOR CUFF REPAIR Left    SHOULDER ARTHROSCOPY WITH SUBACROMIAL DECOMPRESSION, ROTATOR CUFF REPAIR AND BICEP TENDON REPAIR Left 01/20/2019   Procedure: left shoulder arthroscopy, subacromial decompression, distal clavicle resection, mini open rotator cuff repair;  Surgeon: Valeria Batman, MD;  Location: [facility];  Service: Orthopedics;  Laterality: Left;   TONSILLECTOMY     Patient Active Problem List   Diagnosis Date Noted   S/P left rotator cuff repair 01/27/2019   Pain in left shoulder 11/28/2018   Encounter for screening colonoscopy 08/14/2017   Alcohol abuse, daily use 08/14/2017   GERD (gastroesophageal reflux disease) 08/29/2010   Hx of adenomatous colonic polyps 08/29/2010   FHx: colon cancer 08/29/2010    PCP: Carylon Perches, MD   REFERRING PROVIDER: Tressie Stalker, MD  REFERRING DIAG: [phone] (ICD-10-CM) - Spinal stenosis, lumbar region without neurogenic claudication  Rationale for Evaluation and Treatment: Rehabilitation  THERAPY DIAG:  No diagnosis found.  ONSET DATE: chronic  SUBJECTIVE:  SUBJECTIVE STATEMENT: Unable to tolerate mowing w/o need of frequent rest breaks due to elevated back pain.  Remain limited to ambulating 1/2 mile distances w/o need of of stopping due to marked low back pain.  Has also experience radicular symptoms into R hip when pain levels are elevated.    PERTINENT HISTORY:    PAIN:  Are you having pain? Yes: NPRS scale: 8/10 Pain location: low back Pain description: ache Aggravating factors: prolonged standing/walking Relieving factors:  position changes, sitting  PRECAUTIONS: Other: lumbar stenosis  WEIGHT BEARING RESTRICTIONS: No  FALLS:  Has patient fallen in last 6 months? No  OCCUPATION: retired  PLOF: Independent  PATIENT GOALS: To reduce and manage my low back pain  NEXT MD VISIT: July 24th  OBJECTIVE:   DIAGNOSTIC FINDINGS:  IMPRESSION: 1. At L3-L4, progressive moderate to severe canal stenosis. Mild bilateral foraminal stenosis. 2. At L4-L5, 5 mm left discogenic cyst with severe left subarticular recess stenosis and progressive moderate central canal stenosis. Mild bilateral foraminal stenosis.     Electronically Signed   By: Feliberto Harts M.D.   On: 07/08/2022 15:29    PATIENT SURVEYS:  FOTO 51(62 predicted)  SCREENING FOR RED FLAGS: Bowel or bladder incontinence: No   MUSCLE LENGTH: Hamstrings: Right 80 deg; Left 80 deg   POSTURE: rounded shoulders, forward head, decreased lumbar lordosis, and increased thoracic kyphosis   LUMBAR ROM:   AROM eval  Flexion   Extension   Right lateral flexion   Left lateral flexion   Right rotation   Left rotation    (Blank rows = not tested)  LOWER EXTREMITY ROM:   WFL for gait and mobility B  Active  Right eval Left eval  Hip flexion    Hip extension    Hip abduction    Hip adduction    Hip internal rotation    Hip external rotation    Knee flexion    Knee extension    Ankle dorsiflexion    Ankle plantarflexion    Ankle inversion    Ankle eversion     (Blank rows = not tested)  LOWER EXTREMITY MMT:    MMT Right eval Left eval  Hip flexion 4- 4-  Hip extension 4- 4-  Hip abduction 4- 4-  Hip adduction    Hip internal rotation    Hip external rotation    Knee flexion 4- 4-  Knee extension 4- 4-  Ankle dorsiflexion    Ankle plantarflexion 4- 4-  Ankle inversion    Ankle eversion     (Blank rows = not tested)  LUMBAR SPECIAL TESTS:  Straight leg raise test: Negative and Slump test: Negative  FUNCTIONAL  TESTS:  5 times sit to stand: 25s arms crossed 30 seconds chair stand test N/T 10/04/22 17s arms crossed  GAIT: Distance walked: 38ft x2 Assistive device utilized: None Level of assistance: Complete Independence Comments: flexed posture  TODAY'S TREATMENT:    [facility] Adult PT Treatment:                                                DATE: 10/04/22 Therapeutic Exercise: Nustep L4 8 min Seated hamstring stretch 30s x2 B Supine QL stretch 30s x2 B Supine hip flexor stretch 30s x2 B  Curl ups L2 15x  Supine hip fallouts BluTB 15x B, 15/15 unilaterally PPT with alternating  march 10/10 2# STS 5x arms crossed 17s  [facility] Adult PT Treatment:                                                DATE: 09/24/22 Therapeutic Exercise: Nustep L3 8 min Seated hamstring stretch 30s x2 B Supine QL stretch 30s x2 B Supine hip flexor stretch 30s x2 B  Curl ups L1 15x  PPT 3s hold 10x PPT with alternating march 10/10 2# STS 10x   [facility] Adult PT Treatment:                                                DATE: 09/20/22 Therapeutic Exercise: Nustep L2  Seated hamstring stretch 30s x2 B Supine QL stretch 30s x2 B Curl ups L1 10x  PPT 3s hold 10x PPT with alternating march 10/10 PPT with alternating OH flexion                                                                                                                          DATE: 08/30/22 Eval and HEP    PATIENT EDUCATION:  Education details: Discussed eval findings, rehab rationale and POC and patient is in agreement  Person educated: Patient Education method: Explanation Education comprehension: needs further education  HOME EXERCISE PROGRAM: Access Code: [email or web address] URL: [email or web address] Date: 08/30/2022 Prepared by: Gustavus Bryant  Exercises - Supine Posterior Pelvic Tilt  - 2 x daily - 5 x weekly - 2 sets - 10 reps - Sit to Stand with Arms Crossed  - 2 x daily - 5 x weekly - 2 sets - Single Leg Heel Raise with Counter  Support  - 2 x daily - 5 x weekly - 2 sets - 10 reps  ASSESSMENT:  CLINICAL IMPRESSION: Focus of today was assessment of progress pending MD f/u tomorrow.  Perceived functional ability as evicdenced by FOTO score is essentially unchanged.  Low back symptoms limit QOL tasks as patient is unable to ambulate greater than 10 min which limits his ability to tend to his property.  He is unable to mow his grass w/o need of multiple recovery periods due to elevated pain levels.  Gait pattern demonstrates an flexed posture to alleviate pain, adding additional stress to lumbar soft tissues and increasing his fall risk and COG has shifted forward to compensate for pain.  Increased resistance and difficulty of exercises today.  5x STS score improved but still greater than 15s indicating lower body and trunk weakness.  Minimal progress regarding pain and function noted over 4 weeks of OPPT.  Prognosis remains guarded due to underlying degenerative changes.   Patient is a 67 y.o. male who was seen today for physical therapy evaluation  and treatment for chronic low back pain. Symptoms onset following prolonged standing and walking and resolve completely when seated.  He denies radicular symptoms.  He was recommended to undergo surgical correction but requires trial of OPPT prior to surgical authorization.  He has had prior ESI with minimal relief.   OBJECTIVE IMPAIRMENTS: decreased activity tolerance, decreased endurance, decreased mobility, difficulty walking, decreased ROM, decreased strength, impaired perceived functional ability, postural dysfunction, and pain.   ACTIVITY LIMITATIONS: carrying, lifting, bending, sitting, standing, and squatting  PERSONAL FACTORS: Age, Fitness, Time since onset of injury/illness/exacerbation, and 1 comorbidity: stenosis and DM  are also affecting patient's functional outcome.   REHAB POTENTIAL: Good  CLINICAL DECISION MAKING: Evolving/moderate complexity  EVALUATION  COMPLEXITY: Low   GOALS: Goals reviewed with patient? No  SHORT TERM GOALS=LONG TERM GOALS: Target date: 10/25/2022  Patient to demonstrate independence in HEP  Baseline: [id number] Goal status: Met  2.  Decrease 5x STS time to 20s arms crossed Baseline: 25s arms crossed; 17s  Goal status: Met  3.  Increase BLE strength to 4/5 Baseline:  MMT Right eval Left eval  Hip flexion 4- 4-  Hip extension 4- 4-  Hip abduction 4- 4-  Hip adduction    Hip internal rotation    Hip external rotation    Knee flexion 4- 4-  Knee extension 4- 4-  Ankle dorsiflexion    Ankle plantarflexion 4- 4-   Goal status: INITIAL  4.  Increase FOTO score to 62 Baseline: 51; 10/04/22 53 Goal status: Ongoing   PLAN:  PT FREQUENCY: 1x/week  PT DURATION: 6 weeks  PLANNED INTERVENTIONS: Therapeutic exercises, Therapeutic activity, Neuromuscular re-education, Balance training, Gait training, Patient/Family education, Self Care, Joint mobilization, Aquatic Therapy, Dry Needling, Electrical stimulation, Spinal mobilization, Cryotherapy, Moist heat, Manual therapy, and Re-evaluation.  PLAN FOR NEXT SESSION: HEP review and update, manual techniques as appropriate, aerobic tasks, ROM and flexibility activities, strengthening and PREs, TPDN, gait and balance training as needed     Hildred Laser, PT 12/17/2022, 11:37 AM

## 2022-12-19 ENCOUNTER — Ambulatory Visit: Payer: 59 | Attending: Neurosurgery

## 2022-12-19 DIAGNOSIS — M6281 Muscle weakness (generalized): Secondary | ICD-10-CM | POA: Insufficient documentation

## 2022-12-19 DIAGNOSIS — M48061 Spinal stenosis, lumbar region without neurogenic claudication: Secondary | ICD-10-CM | POA: Insufficient documentation

## 2022-12-19 DIAGNOSIS — M5459 Other low back pain: Secondary | ICD-10-CM | POA: Insufficient documentation

## 2022-12-24 NOTE — Therapy (Unsigned)
OUTPATIENT PHYSICAL THERAPY TREATMENT NOTE/DISCHARGE SUMMARY   Patient Name: Louis Mccullough MRN: [id number] DOB:1955/10/22, 67 y.o., male Today's Date: 12/26/2022 PHYSICAL THERAPY DISCHARGE SUMMARY  Visits from Start of Care: 6  Current functional level related to goals / functional outcomes: Maximum function regained   Remaining deficits: Limited activity tolerance due to pain   Education / Equipment: HEP   Patient agrees to discharge. Patient goals were partially met. Patient is being discharged due to maximized rehab potential.   END OF SESSION:  PT End of Session - 12/26/22 1319     Visit Number 6    Number of Visits 6    Date for PT Re-Evaluation 02/18/23    Authorization Type [facility] [employer]    PT Start Time 1315    PT Stop Time 1355    PT Time Calculation (min) 40 min    Activity Tolerance Patient tolerated treatment well    Behavior During Therapy WFL for tasks assessed/performed                 Past Medical History:  Diagnosis Date   GERD (gastroesophageal reflux disease)    states he had previous EGD   Gout    History of bronchitis    HTN (hypertension)    Hx of adenomatous colonic polyps    Hyperlipidemia    Hypertriglyceridemia    Hypothyroidism    Seasonal allergies    Shoulder impingement, left    Wears glasses    Past Surgical History:  Procedure Laterality Date   BILATERAL CARPAL TUNNEL RELEASE Bilateral 02/12/2014   Procedure: BILATERAL CARPAL TUNNEL RELEASE;  Surgeon: Cindee Salt, MD;  Location: [facility];  Service: Orthopedics;  Laterality: Bilateral;   CHOLECYSTECTOMY     COLONOSCOPY  03/12/2004   adenomatous polyp, diverticulosis   COLONOSCOPY  09/29/2010   Procedure: COLONOSCOPY;  Surgeon: Corbin Ade, MD;  [facility];  Service: Endoscopy;  Laterality: N/A;   COLONOSCOPY WITH PROPOFOL N/A 10/24/2017   Procedure: COLONOSCOPY WITH PROPOFOL;  Surgeon: Corbin Ade, MD;  [facility];  Service:  Endoscopy;  Laterality: N/A;  11:00am   COLONOSCOPY WITH PROPOFOL N/A 04/07/2021   Procedure: COLONOSCOPY WITH PROPOFOL;  Surgeon: Corbin Ade, MD;  [facility];  Service: Endoscopy;  Laterality: N/A;  7:30am   NECK SURGERY  03/13/1999   cerv fusion   POLYPECTOMY  10/24/2017   Procedure: POLYPECTOMY;  Surgeon: Corbin Ade, MD;  [facility];  Service: Endoscopy;;  splenic flexure and hepatic flexure, descending colon   POLYPECTOMY  04/07/2021   Procedure: POLYPECTOMY;  Surgeon: Corbin Ade, MD;  [facility];  Service: Endoscopy;;   ROTATOR CUFF REPAIR Right    ROTATOR CUFF REPAIR Left    SHOULDER ARTHROSCOPY WITH SUBACROMIAL DECOMPRESSION, ROTATOR CUFF REPAIR AND BICEP TENDON REPAIR Left 01/20/2019   Procedure: left shoulder arthroscopy, subacromial decompression, distal clavicle resection, mini open rotator cuff repair;  Surgeon: Valeria Batman, MD;  Location: [facility];  Service: Orthopedics;  Laterality: Left;   TONSILLECTOMY     Patient Active Problem List   Diagnosis Date Noted   S/P left rotator cuff repair 01/27/2019   Pain in left shoulder 11/28/2018   Encounter for screening colonoscopy 08/14/2017   Alcohol abuse, daily use 08/14/2017   GERD (gastroesophageal reflux disease) 08/29/2010   Hx of adenomatous colonic polyps 08/29/2010   FHx: colon cancer 08/29/2010    PCP: Carylon Perches,  MD   REFERRING PROVIDER: Tressie Stalker, MD  REFERRING DIAG: [phone] (ICD-10-CM) - Spinal stenosis, lumbar region without neurogenic claudication  Rationale for Evaluation and Treatment: Rehabilitation  THERAPY DIAG:  Lumbar stenosis without neurogenic claudication  Other low back pain  Muscle weakness (generalized)  ONSET DATE: chronic  SUBJECTIVE:                                                                                                                                                                                            SUBJECTIVE STATEMENT: Continued low back symptoms brought on by prolonged walking and performing tasks on his feet including mowing his lawn.  Walking symptoms onset after 1/2 mile/10 min, mowing symptoms onset after 20-30 min requiring a recovery period before he is able to resume task.  Has been compliant with HEP however is frustrated by lack of progress and ongoing symptoms due to nature of degenerative changes.    PERTINENT HISTORY:    PAIN:  Are you having pain? Yes: NPRS scale: 8/10 Pain location: low back Pain description: ache Aggravating factors: prolonged standing/walking Relieving factors: position changes, sitting  PRECAUTIONS: Other: lumbar stenosis  WEIGHT BEARING RESTRICTIONS: No  FALLS:  Has patient fallen in last 6 months? No  OCCUPATION: retired  PLOF: Independent  PATIENT GOALS: To reduce and manage my low back pain  NEXT MD VISIT: Pending  OBJECTIVE:   DIAGNOSTIC FINDINGS:  IMPRESSION: 1. At L3-L4, progressive moderate to severe canal stenosis. Mild bilateral foraminal stenosis. 2. At L4-L5, 5 mm left discogenic cyst with severe left subarticular recess stenosis and progressive moderate central canal stenosis. Mild bilateral foraminal stenosis.     Electronically Signed   By: Feliberto Harts M.D.   On: 07/08/2022 15:29    PATIENT SURVEYS:      FOTO 51(62 predicted) ; 10/12/22 53%  12/26/22 ODI   SCREENING FOR RED FLAGS: Bowel or bladder incontinence: No   MUSCLE LENGTH: Hamstrings: Right 80 deg; Left 80 deg   POSTURE: rounded shoulders, forward head, decreased lumbar lordosis, and increased thoracic kyphosis   LUMBAR ROM:   AROM eval 12/26/22  Flexion  75%  Extension  25%  Right lateral flexion  75%  Left lateral flexion  75%  Right rotation  25%  Left rotation  25%   (Blank rows = not tested)  LOWER EXTREMITY ROM:   WFL for gait and mobility B  Active  Right eval Left eval  Hip flexion    Hip extension    Hip  abduction    Hip adduction    Hip internal rotation    Hip external rotation  Knee flexion    Knee extension    Ankle dorsiflexion    Ankle plantarflexion    Ankle inversion    Ankle eversion     (Blank rows = not tested)  LOWER EXTREMITY MMT:    MMT Right eval Left eval B  12/19/22  Hip flexion 4- 4- 4  Hip extension 4- 4- 4  Hip abduction 4- 4- 4  Hip adduction     Hip internal rotation     Hip external rotation     Knee flexion 4- 4- 4  Knee extension 4- 4- 4  Ankle dorsiflexion     Ankle plantarflexion 4- 4- 4  Ankle inversion     Ankle eversion      (Blank rows = not tested)  LUMBAR SPECIAL TESTS:  Straight leg raise test: Negative and Slump test: Negative 12/19/22 SLR positive for low back discomfort, slump test positive B for reproduction of symptoms 12/26/22 positive slump signs in BLEs   FUNCTIONAL TESTS:  5 times sit to stand: 25s arms crossed 30 seconds chair stand test N/T 10/04/22 17s arms crossed  GAIT: Distance walked: 82ft x2 Assistive device utilized: None Level of assistance: Complete Independence Comments: flexed posture  TODAY'S TREATMENT:    [facility] Adult PT Treatment:                                                DATE: 12/26/22 Therapeutic Exercise: Nustep L4 8 min HEP review Re-assessment of progress and functional mobility  [facility] Adult PT Treatment:                                                DATE: 12/19/22 Therapeutic Exercise: Nustep L4 8 min Seated hamstring stretch 30s B Supine QL stretch 30s B with increased low back discomfort Curl ups with p-ball 10 B, 10/10 unilaterally   [facility] Adult PT Treatment:                                                DATE: 10/04/22 Therapeutic Exercise: Nustep L4 8 min Seated hamstring stretch 30s x2 B Supine QL stretch 30s x2 B Supine hip flexor stretch 30s x2 B  Curl ups L2 15x  Supine hip fallouts BluTB 15x B, 15/15 unilaterally PPT with alternating march 10/10 2# STS 5x arms crossed  17s  [facility] Adult PT Treatment:                                                DATE: 09/24/22 Therapeutic Exercise: Nustep L3 8 min Seated hamstring stretch 30s x2 B Supine QL stretch 30s x2 B Supine hip flexor stretch 30s x2 B  Curl ups L1 15x  PPT 3s hold 10x PPT with alternating march 10/10 2# STS 10x   [facility] Adult PT Treatment:  DATE: 09/20/22 Therapeutic Exercise: Nustep L2  Seated hamstring stretch 30s x2 B Supine QL stretch 30s x2 B Curl ups L1 10x  PPT 3s hold 10x PPT with alternating march 10/10 PPT with alternating OH flexion                                                                                                                          DATE: 08/30/22 Eval and HEP    PATIENT EDUCATION:  Education details: Discussed eval findings, rehab rationale and POC and patient is in agreement  Person educated: Patient Education method: Explanation Education comprehension: needs further education  HOME EXERCISE PROGRAM: Access Code: [email or web address] URL: [email or web address] Date: 08/30/2022 Prepared by: Gustavus Bryant  Exercises - Supine Posterior Pelvic Tilt  - 2 x daily - 5 x weekly - 2 sets - 10 reps - Sit to Stand with Arms Crossed  - 2 x daily - 5 x weekly - 2 sets - Single Leg Heel Raise with Counter Support  - 2 x daily - 5 x weekly - 2 sets - 10 reps  ASSESSMENT:  CLINICAL IMPRESSION: Patient continues to experience symptoms of lumbar stenosis including discomfort and ache across low back with prolonged standing and weight bearing tasks including walking long distances and mowing his lawn.  He has been compliant with his HEP but remains restricted in his ability to participate in ADLS and QOL is impaired as a result.  Limited response to PT interventions to date.  Patient will next f/u with neurosurgeon to discuss need to undergo surgical procedure to address ongoing and worsening deficits.  Further PT not  recommended at this time due to limited response to date and underlying cause of symptoms/degenerative changes.  ODI 22/50 indicating moderate disability   Patient is a 67 y.o. male who was seen today for physical therapy evaluation and treatment for chronic low back pain. Symptoms onset following prolonged standing and walking and resolve completely when seated.  He denies radicular symptoms.  He was recommended to undergo surgical correction but requires trial of OPPT prior to surgical authorization.  He has had prior ESI with minimal relief.   OBJECTIVE IMPAIRMENTS: decreased activity tolerance, decreased endurance, decreased mobility, difficulty walking, decreased ROM, decreased strength, impaired perceived functional ability, postural dysfunction, and pain.   ACTIVITY LIMITATIONS: carrying, lifting, bending, sitting, standing, and squatting  PERSONAL FACTORS: Age, Fitness, Time since onset of injury/illness/exacerbation, and 1 comorbidity: stenosis and DM  are also affecting patient's functional outcome.   REHAB POTENTIAL: Good  CLINICAL DECISION MAKING: Evolving/moderate complexity  EVALUATION COMPLEXITY: Low   GOALS: Goals reviewed with patient? No  SHORT TERM GOALS=LONG TERM GOALS: Target date: 10/25/2022  Patient to demonstrate independence in HEP  Baseline: [id number] Goal status: Met  2.  Decrease 5x STS time to 20s arms crossed Baseline: 25s arms crossed; 17s  Goal status: Met  3.  Increase BLE strength to 4+/5 Baseline:  MMT Right eval Left eval B  12/19/22  Hip  flexion 4- 4- 4  Hip extension 4- 4- 4  Hip abduction 4- 4- 4  Hip adduction     Hip internal rotation     Hip external rotation     Knee flexion 4- 4- 4  Knee extension 4- 4- 4  Ankle dorsiflexion     Ankle plantarflexion 4- 4- 4   Goal status: Ongoing  4.  Increase FOTO score to 62 Baseline: 51; 10/04/22 53; 12/26/22 ODI 22/50 indicating moderate disability Goal status: Ongoing   PLAN:  PT  FREQUENCY: 1x/week  PT DURATION: 2 weeks  PLANNED INTERVENTIONS: Therapeutic exercises, Therapeutic activity, Neuromuscular re-education, Balance training, Gait training, Patient/Family education, Self Care, Joint mobilization, Aquatic Therapy, Dry Needling, Electrical stimulation, Spinal mobilization, Cryotherapy, Moist heat, Manual therapy, and Re-evaluation.  PLAN FOR NEXT SESSION: HEP review and update, manual techniques as appropriate, aerobic tasks, ROM and flexibility activities, strengthening and PREs, TPDN, gait and balance training as needed     Hildred Laser, PT 12/26/2022, 2:13 PM

## 2022-12-26 ENCOUNTER — Ambulatory Visit: Payer: 59

## 2022-12-26 DIAGNOSIS — M48061 Spinal stenosis, lumbar region without neurogenic claudication: Secondary | ICD-10-CM | POA: Diagnosis not present

## 2022-12-26 DIAGNOSIS — M5459 Other low back pain: Secondary | ICD-10-CM

## 2022-12-26 DIAGNOSIS — M6281 Muscle weakness (generalized): Secondary | ICD-10-CM

## 2023-01-18 DIAGNOSIS — M48062 Spinal stenosis, lumbar region with neurogenic claudication: Secondary | ICD-10-CM | POA: Diagnosis not present

## 2023-01-24 DIAGNOSIS — E1129 Type 2 diabetes mellitus with other diabetic kidney complication: Secondary | ICD-10-CM | POA: Diagnosis not present

## 2023-02-26 ENCOUNTER — Other Ambulatory Visit (HOSPITAL_COMMUNITY): Payer: Self-pay

## 2023-02-26 ENCOUNTER — Other Ambulatory Visit: Payer: Self-pay

## 2023-02-27 ENCOUNTER — Other Ambulatory Visit (HOSPITAL_COMMUNITY): Payer: Self-pay

## 2023-03-04 ENCOUNTER — Other Ambulatory Visit (HOSPITAL_COMMUNITY): Payer: Self-pay

## 2023-03-04 DIAGNOSIS — M48062 Spinal stenosis, lumbar region with neurogenic claudication: Secondary | ICD-10-CM | POA: Diagnosis not present

## 2023-03-04 DIAGNOSIS — M5126 Other intervertebral disc displacement, lumbar region: Secondary | ICD-10-CM | POA: Diagnosis not present

## 2023-03-04 MED ORDER — OXYCODONE-ACETAMINOPHEN 5-325 MG PO TABS
1.0000 | ORAL_TABLET | ORAL | 0 refills | Status: DC | PRN
  Filled 2023-03-04: qty 30, 5d supply, fill #0

## 2023-03-04 MED ORDER — METHOCARBAMOL 500 MG PO TABS
500.0000 mg | ORAL_TABLET | Freq: Two times a day (BID) | ORAL | 0 refills | Status: DC | PRN
  Filled 2023-03-04: qty 50, 25d supply, fill #0

## 2023-03-18 ENCOUNTER — Other Ambulatory Visit (HOSPITAL_COMMUNITY): Payer: Self-pay

## 2023-03-18 MED ORDER — CEPHALEXIN 500 MG PO CAPS
500.0000 mg | ORAL_CAPSULE | Freq: Four times a day (QID) | ORAL | 0 refills | Status: DC
  Filled 2023-03-18: qty 28, 7d supply, fill #0

## 2023-03-19 DIAGNOSIS — M48062 Spinal stenosis, lumbar region with neurogenic claudication: Secondary | ICD-10-CM | POA: Diagnosis not present

## 2023-03-28 ENCOUNTER — Other Ambulatory Visit (HOSPITAL_COMMUNITY): Payer: Self-pay

## 2023-04-02 ENCOUNTER — Other Ambulatory Visit (HOSPITAL_COMMUNITY): Payer: Self-pay

## 2023-05-27 ENCOUNTER — Other Ambulatory Visit (HOSPITAL_COMMUNITY): Payer: Self-pay

## 2023-05-29 DIAGNOSIS — E1129 Type 2 diabetes mellitus with other diabetic kidney complication: Secondary | ICD-10-CM | POA: Diagnosis not present

## 2023-06-25 DIAGNOSIS — M4316 Spondylolisthesis, lumbar region: Secondary | ICD-10-CM | POA: Diagnosis not present

## 2023-06-25 DIAGNOSIS — M48062 Spinal stenosis, lumbar region with neurogenic claudication: Secondary | ICD-10-CM | POA: Diagnosis not present

## 2023-08-21 ENCOUNTER — Other Ambulatory Visit (HOSPITAL_COMMUNITY): Payer: Self-pay

## 2023-09-27 ENCOUNTER — Other Ambulatory Visit: Payer: Self-pay

## 2023-09-27 ENCOUNTER — Other Ambulatory Visit (HOSPITAL_COMMUNITY): Payer: Self-pay

## 2023-09-27 DIAGNOSIS — M5186 Other intervertebral disc disorders, lumbar region: Secondary | ICD-10-CM | POA: Diagnosis not present

## 2023-09-27 DIAGNOSIS — M1000 Idiopathic gout, unspecified site: Secondary | ICD-10-CM | POA: Diagnosis not present

## 2023-09-27 DIAGNOSIS — E1129 Type 2 diabetes mellitus with other diabetic kidney complication: Secondary | ICD-10-CM | POA: Diagnosis not present

## 2023-09-27 DIAGNOSIS — I10 Essential (primary) hypertension: Secondary | ICD-10-CM | POA: Diagnosis not present

## 2023-09-27 DIAGNOSIS — Z125 Encounter for screening for malignant neoplasm of prostate: Secondary | ICD-10-CM | POA: Diagnosis not present

## 2023-09-27 DIAGNOSIS — E039 Hypothyroidism, unspecified: Secondary | ICD-10-CM | POA: Diagnosis not present

## 2023-09-27 DIAGNOSIS — E785 Hyperlipidemia, unspecified: Secondary | ICD-10-CM | POA: Diagnosis not present

## 2023-09-27 DIAGNOSIS — Z79899 Other long term (current) drug therapy: Secondary | ICD-10-CM | POA: Diagnosis not present

## 2023-09-30 ENCOUNTER — Other Ambulatory Visit (HOSPITAL_COMMUNITY): Payer: Self-pay

## 2023-10-01 ENCOUNTER — Other Ambulatory Visit (HOSPITAL_COMMUNITY): Payer: Self-pay

## 2023-10-07 ENCOUNTER — Other Ambulatory Visit (HOSPITAL_COMMUNITY): Payer: Self-pay

## 2023-10-07 MED ORDER — KETOCONAZOLE 2 % EX CREA
1.0000 | TOPICAL_CREAM | Freq: Two times a day (BID) | CUTANEOUS | 3 refills | Status: AC
  Filled 2023-10-07: qty 15, 8d supply, fill #0
  Filled 2023-11-19: qty 15, 8d supply, fill #1
  Filled 2023-12-23: qty 15, 8d supply, fill #2
  Filled 2024-02-28: qty 15, 8d supply, fill #3

## 2023-10-07 MED ORDER — TADALAFIL 10 MG PO TABS
10.0000 mg | ORAL_TABLET | ORAL | 4 refills | Status: AC
  Filled 2023-10-07: qty 30, 45d supply, fill #0
  Filled 2023-11-19: qty 30, 45d supply, fill #1

## 2023-11-19 ENCOUNTER — Other Ambulatory Visit (HOSPITAL_COMMUNITY): Payer: Self-pay

## 2023-11-19 ENCOUNTER — Other Ambulatory Visit: Payer: Self-pay

## 2023-11-21 ENCOUNTER — Other Ambulatory Visit (HOSPITAL_COMMUNITY): Payer: Self-pay

## 2023-11-26 DIAGNOSIS — H5203 Hypermetropia, bilateral: Secondary | ICD-10-CM | POA: Diagnosis not present

## 2023-12-12 ENCOUNTER — Other Ambulatory Visit (HOSPITAL_COMMUNITY): Payer: Self-pay

## 2023-12-12 MED ORDER — FLUZONE HIGH-DOSE 0.5 ML IM SUSY
0.5000 mL | PREFILLED_SYRINGE | Freq: Once | INTRAMUSCULAR | 0 refills | Status: AC
  Filled 2023-12-12: qty 0.5, 1d supply, fill #0

## 2023-12-16 ENCOUNTER — Other Ambulatory Visit (HOSPITAL_COMMUNITY): Payer: Self-pay

## 2023-12-16 MED ORDER — COVID-19 MRNA VAC-TRIS(PFIZER) 30 MCG/0.3ML IM SUSY
0.3000 mL | PREFILLED_SYRINGE | Freq: Once | INTRAMUSCULAR | 0 refills | Status: AC
  Filled 2023-12-16: qty 0.3, 1d supply, fill #0

## 2023-12-23 ENCOUNTER — Other Ambulatory Visit: Payer: Self-pay

## 2023-12-23 ENCOUNTER — Other Ambulatory Visit (HOSPITAL_COMMUNITY): Payer: Self-pay

## 2023-12-23 MED ORDER — METFORMIN HCL 500 MG PO TABS
500.0000 mg | ORAL_TABLET | Freq: Two times a day (BID) | ORAL | 4 refills | Status: AC
  Filled 2023-12-23: qty 180, 90d supply, fill #0
  Filled 2024-01-23 – 2024-03-30 (×2): qty 180, 90d supply, fill #1

## 2023-12-24 ENCOUNTER — Other Ambulatory Visit (HOSPITAL_COMMUNITY): Payer: Self-pay

## 2023-12-24 MED ORDER — FREESTYLE TEST VI STRP
ORAL_STRIP | 4 refills | Status: AC
  Filled 2023-12-24: qty 100, 33d supply, fill #0
  Filled 2024-02-28: qty 100, 33d supply, fill #1

## 2023-12-24 MED ORDER — FREESTYLE LANCETS MISC
4 refills | Status: AC
  Filled 2023-12-24: qty 100, 33d supply, fill #0
  Filled 2024-02-28: qty 100, 33d supply, fill #1

## 2023-12-30 ENCOUNTER — Other Ambulatory Visit (HOSPITAL_COMMUNITY): Payer: Self-pay

## 2024-01-23 ENCOUNTER — Other Ambulatory Visit (HOSPITAL_COMMUNITY): Payer: Self-pay

## 2024-01-23 MED ORDER — AMLODIPINE BESYLATE 10 MG PO TABS
10.0000 mg | ORAL_TABLET | Freq: Every day | ORAL | 4 refills | Status: AC
  Filled 2024-01-23 – 2024-02-28 (×2): qty 90, 90d supply, fill #0

## 2024-01-23 MED ORDER — ALLOPURINOL 300 MG PO TABS
300.0000 mg | ORAL_TABLET | Freq: Every day | ORAL | 4 refills | Status: AC
  Filled 2024-01-23 – 2024-02-28 (×2): qty 90, 90d supply, fill #0

## 2024-01-23 MED ORDER — LEVOTHYROXINE SODIUM 112 MCG PO TABS
112.0000 ug | ORAL_TABLET | Freq: Every day | ORAL | 4 refills | Status: AC
  Filled 2024-01-23 – 2024-02-28 (×2): qty 90, 90d supply, fill #0

## 2024-01-23 MED ORDER — HYDROCHLOROTHIAZIDE 25 MG PO TABS
25.0000 mg | ORAL_TABLET | Freq: Every day | ORAL | 4 refills | Status: AC
  Filled 2024-01-23 – 2024-02-28 (×2): qty 90, 90d supply, fill #0

## 2024-01-23 MED ORDER — PANTOPRAZOLE SODIUM 40 MG PO TBEC
40.0000 mg | DELAYED_RELEASE_TABLET | Freq: Every day | ORAL | 4 refills | Status: AC
  Filled 2024-01-23 – 2024-02-28 (×2): qty 90, 90d supply, fill #0

## 2024-01-23 MED ORDER — SIMVASTATIN 20 MG PO TABS
20.0000 mg | ORAL_TABLET | Freq: Every day | ORAL | 4 refills | Status: AC
  Filled 2024-01-23 – 2024-02-28 (×2): qty 90, 90d supply, fill #0

## 2024-01-23 MED ORDER — TELMISARTAN 80 MG PO TABS
80.0000 mg | ORAL_TABLET | Freq: Every day | ORAL | 4 refills | Status: AC
  Filled 2024-02-28: qty 90, 90d supply, fill #0

## 2024-01-24 ENCOUNTER — Other Ambulatory Visit (HOSPITAL_COMMUNITY): Payer: Self-pay

## 2024-01-27 ENCOUNTER — Other Ambulatory Visit (HOSPITAL_COMMUNITY): Payer: Self-pay

## 2024-01-28 ENCOUNTER — Other Ambulatory Visit (HOSPITAL_COMMUNITY): Payer: Self-pay

## 2024-02-05 ENCOUNTER — Other Ambulatory Visit (HOSPITAL_COMMUNITY): Payer: Self-pay

## 2024-02-19 DIAGNOSIS — E1129 Type 2 diabetes mellitus with other diabetic kidney complication: Secondary | ICD-10-CM | POA: Diagnosis not present

## 2024-02-19 DIAGNOSIS — E039 Hypothyroidism, unspecified: Secondary | ICD-10-CM | POA: Diagnosis not present

## 2024-02-19 DIAGNOSIS — E785 Hyperlipidemia, unspecified: Secondary | ICD-10-CM | POA: Diagnosis not present

## 2024-02-19 DIAGNOSIS — I10 Essential (primary) hypertension: Secondary | ICD-10-CM | POA: Diagnosis not present

## 2024-02-28 ENCOUNTER — Other Ambulatory Visit (HOSPITAL_COMMUNITY): Payer: Self-pay

## 2024-02-28 ENCOUNTER — Other Ambulatory Visit: Payer: Self-pay

## 2024-03-02 ENCOUNTER — Other Ambulatory Visit (HOSPITAL_COMMUNITY): Payer: Self-pay

## 2024-03-02 MED ORDER — AMOXICILLIN 500 MG PO CAPS
500.0000 mg | ORAL_CAPSULE | Freq: Three times a day (TID) | ORAL | 1 refills | Status: AC
  Filled 2024-03-02: qty 21, 7d supply, fill #0

## 2024-03-30 ENCOUNTER — Other Ambulatory Visit (HOSPITAL_COMMUNITY): Payer: Self-pay
